# Patient Record
Sex: Female | Born: 1940 | Marital: Married
Health system: Southern US, Community
[De-identification: ages and names within clinical notes are randomized; demographics above are authoritative.]

---

## 2013-10-13 ENCOUNTER — Inpatient Hospital Stay
Admission: AD | Admit: 2013-10-13 | Discharge: 2013-11-02 | Disposition: A | Discharge status: Home Health | Payer: Self-pay | Source: Ambulatory Visit | Attending: Internal Medicine | Admitting: Internal Medicine

## 2013-10-13 LAB — BLOOD GAS, ARTERIAL
Acid-Base Excess: 12.2 mmol/L — ABNORMAL HIGH (ref 0.0–2.0)
Bicarbonate: 37.7 mEq/L — ABNORMAL HIGH (ref 20.0–24.0)
Delivery systems: POSITIVE
EXPIRATORY PAP: 6
FIO2: 0.7 %
Inspiratory PAP: 16
O2 Saturation: 98.7 %
PO2 ART: 146 mmHg — AB (ref 80.0–100.0)
Patient temperature: 98.6
TCO2: 39.6 mmol/L (ref 0–100)
pCO2 arterial: 63.7 mmHg (ref 35.0–45.0)
pH, Arterial: 7.389 (ref 7.350–7.450)

## 2013-10-14 ENCOUNTER — Other Ambulatory Visit (HOSPITAL_COMMUNITY): Payer: Self-pay

## 2013-10-14 LAB — CBC
HCT: 24 % — ABNORMAL LOW (ref 36.0–46.0)
HEMOGLOBIN: 7.3 g/dL — AB (ref 12.0–15.0)
MCH: 33.8 pg (ref 26.0–34.0)
MCHC: 30.4 g/dL (ref 30.0–36.0)
MCV: 111.1 fL — ABNORMAL HIGH (ref 78.0–100.0)
Platelets: 304 10*3/uL (ref 150–400)
RBC: 2.16 MIL/uL — ABNORMAL LOW (ref 3.87–5.11)
RDW: 14.9 % (ref 11.5–15.5)
WBC: 19.2 10*3/uL — AB (ref 4.0–10.5)

## 2013-10-14 LAB — COMPREHENSIVE METABOLIC PANEL
ALK PHOS: 49 U/L (ref 39–117)
ALT: 45 U/L — AB (ref 0–35)
AST: 60 U/L — ABNORMAL HIGH (ref 0–37)
Albumin: 2.1 g/dL — ABNORMAL LOW (ref 3.5–5.2)
Anion gap: 11 (ref 5–15)
BUN: 47 mg/dL — ABNORMAL HIGH (ref 6–23)
CO2: 38 mEq/L — ABNORMAL HIGH (ref 19–32)
Calcium: 8.7 mg/dL (ref 8.4–10.5)
Chloride: 103 mEq/L (ref 96–112)
Creatinine, Ser: 1.14 mg/dL — ABNORMAL HIGH (ref 0.50–1.10)
GFR, EST AFRICAN AMERICAN: 54 mL/min — AB (ref >90)
GFR, EST NON AFRICAN AMERICAN: 47 mL/min — AB (ref >90)
GLUCOSE: 135 mg/dL — AB (ref 70–99)
POTASSIUM: 3.2 meq/L — AB (ref 3.7–5.3)
SODIUM: 152 meq/L — AB (ref 137–147)
Total Bilirubin: 0.3 mg/dL (ref 0.3–1.2)
Total Protein: 5 g/dL — ABNORMAL LOW (ref 6.0–8.3)

## 2013-10-14 LAB — TYPE AND SCREEN
ABO/RH(D): O NEG
Antibody Screen: NEGATIVE

## 2013-10-14 LAB — VITAMIN B12: Vitamin B-12: 569 pg/mL (ref 211–911)

## 2013-10-14 LAB — ABO/RH: ABO/RH(D): O NEG

## 2013-10-14 NOTE — Progress Notes (Signed)
[facility]                                                                                              Progress note     Patient Demographics  Breanna King, is a 73 y.o. female  ZOX:[id number]  [id number]  DOB - [birth year]  Admit date - 10/13/2013  Admitting Physician Carron Curie, MD  Outpatient Primary MD for the patient is No PCP Per Patient  LOS - 1   Chief complaint Respiratory failure Pneumonia         Subjective:   [name] today has, No headache, No chest pain, still complaining of shortness of breath  Objective:   Vital signs  Temperature 98.8 Heart rate 66 Respiratory rate 107 Blood pressure 106/66 Pulse ox 100%    Exam Drowsy, very hard of hearing and confused [place].AT,PERRAL Supple Neck,No JVD, No cervical lymphadenopathy appriciated.  Symmetrical Chest wall movement, decreased breath sounds bilaterally with mild rhonchi RRR,No Gallops,Rubs or new Murmurs, No Parasternal Heave +ve B.Sounds, Abd Soft, Non tender, No organomegaly appriciated, No rebound - guarding or rigidity. No Cyanosis, Clubbing +2 edema,    I&Os -400    Data Review   CBC  Recent Labs Lab 10/14/13 0745  WBC 19.2*  HGB 7.3*  HCT 24.0*  PLT 304  MCV 111.1*  MCH 33.8  MCHC 30.4  RDW 14.9    Chemistries   Recent Labs Lab 10/14/13 0745  NA 152*  K 3.2*  CL 103  CO2 38*  GLUCOSE 135*  BUN 47*  CREATININE 1.14*  CALCIUM 8.7  AST 60*  ALT 45*  ALKPHOS 49  BILITOT 0.3   ------------------------------------------------------------------------------------------------------------------ CrCl is unknown because there is no height on file for the current visit. ------------------------------------------------------------------------------------------------------------------ No results found for this basename: HGBA1C,  in the last 72  hours ------------------------------------------------------------------------------------------------------------------ No results found for this basename: CHOL, HDL, LDLCALC, TRIG, CHOLHDL, LDLDIRECT,  in the last 72 hours ------------------------------------------------------------------------------------------------------------------ No results found for this basename: TSH, T4TOTAL, FREET3, T3FREE, THYROIDAB,  in the last 72 hours ------------------------------------------------------------------------------------------------------------------  Recent Labs  10/14/13 0745  VITAMINB12 569    Coagulation profile No results found for this basename: INR, PROTIME,  in the last 168 hours  No results found for this basename: DDIMER,  in the last 72 hours  Cardiac Enzymes No results found for this basename: CK, CKMB, TROPONINI, MYOGLOBIN,  in the last 168 hours ------------------------------------------------------------------------------------------------------------------ No components found with this basename: POCBNP,   Micro Results No results found for this or any previous visit (from the past 240 hour(s)).     Assessment & Plan   Respiratory failure on high flow oxygen during the day and BiPAP at night Community acquired pneumonia minimal changed to Zosyn History of GI bleed not a candidate for endoscopic evaluation off blood thinners History of atrial fibrillation with normal sinus rhythm at this time not on chronic anticoagulation History of coronary artery disease status post 2 stent placements last one was 2007 off Plavix due to bleeding COPD/obstructive sleep apnea continue with nebulizer treatments, BiPAP and prednisone Diabetes mellitus type 2, continue with general  via full insulin sliding scale Anemia of chronic disease hyperlipidemia Recent subcutaneous hematoma Hypernatremia on D5 water Hypokalemia replete  Plan  Check ABGs in a.m. D5 water Change meropenem  to Zosyn Replace potassium Taper prednisone Critical care time; 31 minutes  Code Status: Full  Family Communication: At bedside    DVT Prophylaxis   SCDs   Carron Curie M.D on 10/14/2013 at 2:24 PM

## 2013-10-15 LAB — BLOOD GAS, ARTERIAL
Acid-Base Excess: 11.7 mmol/L — ABNORMAL HIGH (ref 0.0–2.0)
Bicarbonate: 36.7 mEq/L — ABNORMAL HIGH (ref 20.0–24.0)
Delivery systems: POSITIVE
Expiratory PAP: 8
FIO2: 0.5 %
Inspiratory PAP: 18
O2 SAT: 99 %
PATIENT TEMPERATURE: 98
TCO2: 38.5 mmol/L (ref 0–100)
pCO2 arterial: 57.3 mmHg (ref 35.0–45.0)
pH, Arterial: 7.421 (ref 7.350–7.450)
pO2, Arterial: 125 mmHg — ABNORMAL HIGH (ref 80.0–100.0)

## 2013-10-15 LAB — BASIC METABOLIC PANEL
ANION GAP: 10 (ref 5–15)
BUN: 33 mg/dL — ABNORMAL HIGH (ref 6–23)
CO2: 33 meq/L — AB (ref 19–32)
Calcium: 9.1 mg/dL (ref 8.4–10.5)
Chloride: 107 mEq/L (ref 96–112)
Creatinine, Ser: 0.9 mg/dL (ref 0.50–1.10)
GFR calc Af Amer: 72 mL/min — ABNORMAL LOW (ref >90)
GFR, EST NON AFRICAN AMERICAN: 62 mL/min — AB (ref >90)
Glucose, Bld: 277 mg/dL — ABNORMAL HIGH (ref 70–99)
POTASSIUM: 4.6 meq/L (ref 3.7–5.3)
SODIUM: 150 meq/L — AB (ref 137–147)

## 2013-10-15 NOTE — Progress Notes (Addendum)
[facility]                                                                                              Progress note     Patient Demographics  Breanna King, is a 73 y.o. female  [id number]  [id number]  DOB - 03-03-1940  Admit date - 10/13/2013  Admitting Physician Carron Curie, MD  Outpatient Primary MD for the patient is No PCP Per Patient  LOS - 2   Chief complaint Respiratory failure Pneumonia         Subjective:   Breanna King today has, No headache, No chest pain, still complaining of shortness of breath  Objective:   Vital signs  Temperature 97.6 Heart rate 89 Respiratory rate 15 Blood pressure 141/79 Pulse ox 100%    Exam Drowsy, very hard of hearing and confused [place].AT,PERRAL Supple Neck,No JVD, No cervical lymphadenopathy appriciated.  Symmetrical Chest wall movement, decreased breath sounds bilaterally with mild rhonchi RRR,No Gallops,Rubs or new Murmurs, No Parasternal Heave +ve B.Sounds, Abd Soft, Non tender, No organomegaly appriciated, No rebound - guarding or rigidity. No Cyanosis, Clubbing +2 edema,    I&Os unknown    Data Review   CBC  Recent Labs Lab 10/14/13 0745  WBC 19.2*  HGB 7.3*  HCT 24.0*  PLT 304  MCV 111.1*  MCH 33.8  MCHC 30.4  RDW 14.9    Chemistries   Recent Labs Lab 10/14/13 0745 10/15/13 0527  NA 152* 150*  K 3.2* 4.6  CL 103 107  CO2 38* 33*  GLUCOSE 135* 277*  BUN 47* 33*  CREATININE 1.14* 0.90  CALCIUM 8.7 9.1  AST 60*  --   ALT 45*  --   ALKPHOS 49  --   BILITOT 0.3  --    ------------------------------------------------------------------------------------------------------------------ CrCl is unknown because there is no height on file for the current visit. ------------------------------------------------------------------------------------------------------------------ No results found  for this basename: HGBA1C,  in the last 72 hours ------------------------------------------------------------------------------------------------------------------ No results found for this basename: CHOL, HDL, LDLCALC, TRIG, CHOLHDL, LDLDIRECT,  in the last 72 hours ------------------------------------------------------------------------------------------------------------------ No results found for this basename: TSH, T4TOTAL, FREET3, T3FREE, THYROIDAB,  in the last 72 hours ------------------------------------------------------------------------------------------------------------------  Recent Labs  10/14/13 0745  VITAMINB12 569    Coagulation profile No results found for this basename: INR, PROTIME,  in the last 168 hours  No results found for this basename: DDIMER,  in the last 72 hours  Cardiac Enzymes No results found for this basename: CK, CKMB, TROPONINI, MYOGLOBIN,  in the last 168 hours ------------------------------------------------------------------------------------------------------------------ No components found with this basename: POCBNP,   Micro Results No results found for this or any previous visit (from the past 240 hour(s)).     Assessment & Plan   Respiratory failure on high flow oxygen during the day and BiPAP at night Community acquired pneumonia minimal changed to Zosyn History of GI bleed not a candidate for endoscopic evaluation off blood thinners History of atrial fibrillation with normal sinus rhythm at this time not on chronic anticoagulation History of coronary artery disease status post 2 stent placements last one was 2007  off Plavix due to bleeding COPD/obstructive sleep apnea continue with nebulizer treatments, BiPAP and prednisone Diabetes mellitus type 2, uncontrolled due to D5 water start Levemir and continue insulin sliding scale Anemia of chronic disease hyperlipidemia Recent subcutaneous hematoma Hypernatremia on D5  water Hypokalemia replete   Plan  Change IV fluids to D5 water Check BMP in a.m. Start Lantus Critical care time 33 minutes Code Status: Full  Family Communication: At bedside    DVT Prophylaxis   SCDs   Carron Curie M.D on 10/15/2013 at 11:41 AM

## 2013-10-16 LAB — BLOOD GAS, ARTERIAL
Acid-Base Excess: 10.6 mmol/L — ABNORMAL HIGH (ref 0.0–2.0)
BICARBONATE: 35.4 meq/L — AB (ref 20.0–24.0)
FIO2: 0.6 %
O2 Saturation: 98.6 %
PCO2 ART: 54.5 mmHg — AB (ref 35.0–45.0)
PH ART: 7.429 (ref 7.350–7.450)
PO2 ART: 101 mmHg — AB (ref 80.0–100.0)
Patient temperature: 98.6
TCO2: 37.1 mmol/L (ref 0–100)

## 2013-10-16 LAB — BASIC METABOLIC PANEL
Anion gap: 8 (ref 5–15)
BUN: 22 mg/dL (ref 6–23)
CALCIUM: 8.8 mg/dL (ref 8.4–10.5)
CHLORIDE: 103 meq/L (ref 96–112)
CO2: 34 meq/L — AB (ref 19–32)
CREATININE: 0.91 mg/dL (ref 0.50–1.10)
GFR calc Af Amer: 71 mL/min — ABNORMAL LOW (ref >90)
GFR calc non Af Amer: 62 mL/min — ABNORMAL LOW (ref >90)
Glucose, Bld: 123 mg/dL — ABNORMAL HIGH (ref 70–99)
Potassium: 4.8 mEq/L (ref 3.7–5.3)
Sodium: 145 mEq/L (ref 137–147)

## 2013-10-16 NOTE — Progress Notes (Signed)
[facility]                                                                                              Progress note     Patient Demographics  Breanna King, is a 73 y.o. female  [id number]  [id number]  DOB - 1941-02-14  Admit date - 10/13/2013  Admitting Physician Carron Curie, MD  Outpatient Primary MD for the patient is No PCP Per Patient  LOS - 3   Chief complaint Respiratory failure Pneumonia         Subjective:   [name] obtunded, cannot give any history  Objective:   Vital signs  Temperature 99.6 Heart rate 84 Respiratory rate 15 Blood pressure 92/66 Pulse ox 95%    Exam Obtunded [place].AT,PERRAL Supple Neck,No JVD, No cervical lymphadenopathy appriciated.  Symmetrical Chest wall movement, decreased breath sounds bilaterally with mild rhonchi RRR,No Gallops,Rubs or new Murmurs, No Parasternal Heave +ve B.Sounds, Abd Soft, Non tender, No organomegaly appriciated, No rebound - guarding or rigidity. No Cyanosis, Clubbing +2 edema,    I&Os unknown    Data Review   CBC  Recent Labs Lab 10/14/13 0745  WBC 19.2*  HGB 7.3*  HCT 24.0*  PLT 304  MCV 111.1*  MCH 33.8  MCHC 30.4  RDW 14.9    Chemistries   Recent Labs Lab 10/14/13 0745 10/15/13 0527 10/16/13 0720  NA 152* 150* 145  K 3.2* 4.6 4.8  CL 103 107 103  CO2 38* 33* 34*  GLUCOSE 135* 277* 123*  BUN 47* 33* 22  CREATININE 1.14* 0.90 0.91  CALCIUM 8.7 9.1 8.8  AST 60*  --   --   ALT 45*  --   --   ALKPHOS 49  --   --   BILITOT 0.3  --   --    ------------------------------------------------------------------------------------------------------------------ CrCl is unknown because there is no height on file for the current visit. ------------------------------------------------------------------------------------------------------------------ No results found for this  basename: HGBA1C,  in the last 72 hours ------------------------------------------------------------------------------------------------------------------ No results found for this basename: CHOL, HDL, LDLCALC, TRIG, CHOLHDL, LDLDIRECT,  in the last 72 hours ------------------------------------------------------------------------------------------------------------------ No results found for this basename: TSH, T4TOTAL, FREET3, T3FREE, THYROIDAB,  in the last 72 hours ------------------------------------------------------------------------------------------------------------------  Recent Labs  10/14/13 0745  VITAMINB12 569    Coagulation profile No results found for this basename: INR, PROTIME,  in the last 168 hours  No results found for this basename: DDIMER,  in the last 72 hours  Cardiac Enzymes No results found for this basename: CK, CKMB, TROPONINI, MYOGLOBIN,  in the last 168 hours ------------------------------------------------------------------------------------------------------------------ No components found with this basename: POCBNP,   Micro Results No results found for this or any previous visit (from the past 240 hour(s)).     Assessment & Plan   Respiratory failure on high flow oxygen during the day and BiPAP at night, has been on BiPAP most of the time Community acquired pneumonia minimal changed to Zosyn History of GI bleed not a candidate for endoscopic evaluation off blood thinners History of atrial fibrillation with normal sinus rhythm at this time not on  chronic anticoagulation History of coronary artery disease status post 2 stent placements last one was 2007 off Plavix due to bleeding COPD/obstructive sleep apnea continue with nebulizer treatments, BiPAP and prednisone Diabetes mellitus type 2, uncontrolled due to D5 water start Levemir and continue insulin sliding scale Anemia of chronic disease hyperlipidemia Recent subcutaneous  hematoma Hypernatremia on D5 water Hypokalemia replete   Plan  Decrease IV fluids Check ABG stat Start Lantus Critical care time 33 minutes Code Status: Full  Family Communication: At bedside    DVT Prophylaxis   SCDs   Carron Curie M.D on 10/16/2013 at 9:18 AM

## 2013-10-17 LAB — BASIC METABOLIC PANEL
Anion gap: 8 (ref 5–15)
BUN: 19 mg/dL (ref 6–23)
CHLORIDE: 104 meq/L (ref 96–112)
CO2: 33 meq/L — AB (ref 19–32)
CREATININE: 0.92 mg/dL (ref 0.50–1.10)
Calcium: 9 mg/dL (ref 8.4–10.5)
GFR calc non Af Amer: 61 mL/min — ABNORMAL LOW (ref >90)
GFR, EST AFRICAN AMERICAN: 70 mL/min — AB (ref >90)
Glucose, Bld: 81 mg/dL (ref 70–99)
Potassium: 5.7 mEq/L — ABNORMAL HIGH (ref 3.7–5.3)
SODIUM: 145 meq/L (ref 137–147)

## 2013-10-17 NOTE — Progress Notes (Signed)
[facility]                                                                                              Progress note     Patient Demographics  Breanna King, is a 73 y.o. female  [id number]  [id number]  DOB - September 28, 1940  Admit date - 10/13/2013  Admitting Physician Carron Curie, MD  Outpatient Primary MD for the patient is No PCP Per Patient  LOS - 4   Chief complaint Respiratory failure Pneumonia         Subjective:   [name] awake and alert, cannot give any history  Objective:   Vital signs  Temperature 97.6 Heart rate 97 Respiratory rate 12 Blood pressure 136/88 Pulse ox 92%    Exam Obtunded [place].AT,PERRAL Supple Neck,No JVD, No cervical lymphadenopathy appriciated.  Symmetrical Chest wall movement, decreased breath sounds bilaterally with mild rhonchi RRR,No Gallops,Rubs or new Murmurs, No Parasternal Heave +ve B.Sounds, Abd Soft, Non tender, No organomegaly appriciated, No rebound - guarding or rigidity. No Cyanosis, Clubbing +2 edema,    I&Os +1260    Data Review   CBC  Recent Labs Lab 10/14/13 0745  WBC 19.2*  HGB 7.3*  HCT 24.0*  PLT 304  MCV 111.1*  MCH 33.8  MCHC 30.4  RDW 14.9    Chemistries   Recent Labs Lab 10/14/13 0745 10/15/13 0527 10/16/13 0720 10/17/13 0600  NA 152* 150* 145 145  K 3.2* 4.6 4.8 5.7*  CL 103 107 103 104  CO2 38* 33* 34* 33*  GLUCOSE 135* 277* 123* 81  BUN 47* 33* 22 19  CREATININE 1.14* 0.90 0.91 0.92  CALCIUM 8.7 9.1 8.8 9.0  AST 60*  --   --   --   ALT 45*  --   --   --   ALKPHOS 49  --   --   --   BILITOT 0.3  --   --   --    ------------------------------------------------------------------------------------------------------------------ CrCl is unknown because there is no height on file for the current  visit. ------------------------------------------------------------------------------------------------------------------ No results found for this basename: HGBA1C,  in the last 72 hours ------------------------------------------------------------------------------------------------------------------ No results found for this basename: CHOL, HDL, LDLCALC, TRIG, CHOLHDL, LDLDIRECT,  in the last 72 hours ------------------------------------------------------------------------------------------------------------------ No results found for this basename: TSH, T4TOTAL, FREET3, T3FREE, THYROIDAB,  in the last 72 hours ------------------------------------------------------------------------------------------------------------------ No results found for this basename: VITAMINB12, FOLATE, FERRITIN, TIBC, IRON, RETICCTPCT,  in the last 72 hours  Coagulation profile No results found for this basename: INR, PROTIME,  in the last 168 hours  No results found for this basename: DDIMER,  in the last 72 hours  Cardiac Enzymes No results found for this basename: CK, CKMB, TROPONINI, MYOGLOBIN,  in the last 168 hours ------------------------------------------------------------------------------------------------------------------ No components found with this basename: POCBNP,   Micro Results No results found for this or any previous visit (from the past 240 hour(s)).     Assessment & Plan   Respiratory failure on high flow oxygen during the day and BiPAP at night, has been on BiPAP most of the time Community acquired  pneumonia minimal changed to Zosyn History of GI bleed not a candidate for endoscopic evaluation off blood thinners History of atrial fibrillation with normal sinus rhythm at this time not on chronic anticoagulation History of coronary artery disease status post 2 stent placements last one was 2007 off Plavix due to bleeding COPD/obstructive sleep apnea continue with nebulizer  treatments, BiPAP and prednisone Diabetes mellitus type 2, uncontrolled due to D5 water start Levemir and continue insulin sliding scale Anemia of chronic disease hyperlipidemia Recent subcutaneous hematoma Hypernatremia on D5 water, resolving Hyperkalemia discontinue by mouth potassium   Plan  Discontinue IV fluids Discontinue by mouth potassium Check BMP in a.m. Code Status: Full  Family Communication: At bedside    DVT Prophylaxis   SCDs   Carron Curie M.D on 10/17/2013 at 11:10 AM

## 2013-10-18 LAB — BASIC METABOLIC PANEL
ANION GAP: 10 (ref 5–15)
BUN: 16 mg/dL (ref 6–23)
CALCIUM: 9 mg/dL (ref 8.4–10.5)
CHLORIDE: 104 meq/L (ref 96–112)
CO2: 33 meq/L — AB (ref 19–32)
CREATININE: 0.98 mg/dL (ref 0.50–1.10)
GFR calc non Af Amer: 56 mL/min — ABNORMAL LOW (ref >90)
GFR, EST AFRICAN AMERICAN: 65 mL/min — AB (ref >90)
Glucose, Bld: 54 mg/dL — ABNORMAL LOW (ref 70–99)
Potassium: 4.2 mEq/L (ref 3.7–5.3)
SODIUM: 147 meq/L (ref 137–147)

## 2013-10-18 NOTE — Progress Notes (Signed)
[facility]                                                                                              Progress note     Patient Demographics  Breanna King, is a 73 y.o. female  [id number]  [id number]  DOB - 1940/11/09  Admit date - 10/13/2013  Admitting Physician Carron Curie, MD  Outpatient Primary MD for the patient is No PCP Per Patient  LOS - 5   Chief complaint Respiratory failure Pneumonia         Subjective:   [name] awake and alert, confused  Objective:   Vital signs  Temperature 98.1 Heart rate 82 Respiratory rate 20 Blood pressure 109/70 Pulse ox 99%    Exam Obtunded [place].AT,PERRAL Supple Neck,No JVD, No cervical lymphadenopathy appriciated.  Symmetrical Chest wall movement, decreased breath sounds bilaterally with mild rhonchi RRR,No Gallops,Rubs or new Murmurs, No Parasternal Heave +ve B.Sounds, Abd Soft, Non tender, No organomegaly appriciated, No rebound - guarding or rigidity. No Cyanosis, Clubbing +2 edema,    I&Os +115    Data Review   CBC  Recent Labs Lab 10/14/13 0745  WBC 19.2*  HGB 7.3*  HCT 24.0*  PLT 304  MCV 111.1*  MCH 33.8  MCHC 30.4  RDW 14.9    Chemistries   Recent Labs Lab 10/14/13 0745 10/15/13 0527 10/16/13 0720 10/17/13 0600 10/18/13 0736  NA 152* 150* 145 145 147  K 3.2* 4.6 4.8 5.7* 4.2  CL 103 107 103 104 104  CO2 38* 33* 34* 33* 33*  GLUCOSE 135* 277* 123* 81 54*  BUN 47* 33* CREATININE 1.14* 0.90 0.91 0.92 0.98  CALCIUM 8.7 9.1 8.8 9.0 9.0  AST 60*  --   --   --   --   ALT 45*  --   --   --   --   ALKPHOS 49  --   --   --   --   BILITOT 0.3  --   --   --   --    ------------------------------------------------------------------------------------------------------------------ CrCl is unknown because there is no height on file for the current  visit. ------------------------------------------------------------------------------------------------------------------ No results found for this basename: HGBA1C,  in the last 72 hours ------------------------------------------------------------------------------------------------------------------ No results found for this basename: CHOL, HDL, LDLCALC, TRIG, CHOLHDL, LDLDIRECT,  in the last 72 hours ------------------------------------------------------------------------------------------------------------------ No results found for this basename: TSH, T4TOTAL, FREET3, T3FREE, THYROIDAB,  in the last 72 hours ------------------------------------------------------------------------------------------------------------------ No results found for this basename: VITAMINB12, FOLATE, FERRITIN, TIBC, IRON, RETICCTPCT,  in the last 72 hours  Coagulation profile No results found for this basename: INR, PROTIME,  in the last 168 hours  No results found for this basename: DDIMER,  in the last 72 hours  Cardiac Enzymes No results found for this basename: CK, CKMB, TROPONINI, MYOGLOBIN,  in the last 168 hours ------------------------------------------------------------------------------------------------------------------ No components found with this basename: POCBNP,   Micro Results No results found for this or any previous visit (from the past 240 hour(s)).     Assessment & Plan   Respiratory failure on high  flow oxygen during the day and BiPAP at night, and when necessary Community acquired pneumonia minimal changed to Zosyn History of GI bleed not a candidate for endoscopic evaluation off blood thinners History of atrial fibrillation with normal sinus rhythm at this time not on chronic anticoagulation History of coronary artery disease status post 2 stent placements last one was 2007 off Plavix due to bleeding COPD/obstructive sleep apnea continue with nebulizer treatments, BiPAP and  prednisone Diabetes mellitus type 2, uncontrolled due to D5 water start Levemir and continue insulin sliding scale Anemia of chronic disease hyperlipidemia Recent subcutaneous hematoma Hypernatremia on D5 water, resolving Hyperkalemia discontinue by mouth potassium   Plan  Continue same treatment Code Status: Full  Family Communication: At bedside    DVT Prophylaxis   SCDs   Carron Curie M.D on 10/18/2013 at 2:11 PM

## 2013-10-19 ENCOUNTER — Other Ambulatory Visit (HOSPITAL_COMMUNITY): Payer: Self-pay

## 2013-10-19 LAB — CBC
HEMATOCRIT: 23.4 % — AB (ref 36.0–46.0)
HEMOGLOBIN: 7.2 g/dL — AB (ref 12.0–15.0)
MCH: 35.1 pg — ABNORMAL HIGH (ref 26.0–34.0)
MCHC: 30.8 g/dL (ref 30.0–36.0)
MCV: 114.1 fL — ABNORMAL HIGH (ref 78.0–100.0)
Platelets: 291 10*3/uL (ref 150–400)
RBC: 2.05 MIL/uL — ABNORMAL LOW (ref 3.87–5.11)
RDW: 16 % — AB (ref 11.5–15.5)
WBC: 11.9 10*3/uL — AB (ref 4.0–10.5)

## 2013-10-19 NOTE — Progress Notes (Signed)
[facility]                                                                                              Progress note     Patient Demographics  Breanna King, is a 73 y.o. female  ZOX:[id number]  [id number]  DOB - 03-18-1940  Admit date - 10/13/2013  Admitting Physician Carron Curie, MD  Outpatient Primary MD for the patient is No PCP Per Patient  LOS - 6   Chief complaint Respiratory failure Pneumonia         Subjective:   Breanna King awake and alert, confused, necessitating more BiPAP lately Objective:   Vital signs  Temperature 98.2 Heart rate 79 Respiratory rate 20 Blood pressure 131/80 Pulse ox 98%    Exam Obtunded [name].AT,PERRAL Supple Neck,No JVD, No cervical lymphadenopathy appriciated.  Symmetrical Chest wall movement, decreased breath sounds bilaterally with mild rhonchi RRR,No Gallops,Rubs or new Murmurs, No Parasternal Heave +ve B.Sounds, Abd Soft, Non tender, No organomegaly appriciated, No rebound - guarding or rigidity. No Cyanosis, Clubbing +2 edema,    I&Os -420    Data Review   CBC  Recent Labs Lab 10/14/13 0745  WBC 19.2*  HGB 7.3*  HCT 24.0*  PLT 304  MCV 111.1*  MCH 33.8  MCHC 30.4  RDW 14.9    Chemistries   Recent Labs Lab 10/14/13 0745 10/15/13 0527 10/16/13 0720 10/17/13 0600 10/18/13 0736  NA 152* 150* 145 145 147  K 3.2* 4.6 4.8 5.7* 4.2  CL 103 107 103 104 104  CO2 38* 33* 34* 33* 33*  GLUCOSE 135* 277* 123* 81 54*  BUN 47* 33* CREATININE 1.14* 0.90 0.91 0.92 0.98  CALCIUM 8.7 9.1 8.8 9.0 9.0  AST 60*  --   --   --   --   ALT 45*  --   --   --   --   ALKPHOS 49  --   --   --   --   BILITOT 0.3  --   --   --   --    ------------------------------------------------------------------------------------------------------------------ CrCl is unknown because there is no height on file for the current  visit. ------------------------------------------------------------------------------------------------------------------ No results found for this basename: HGBA1C,  in the last 72 hours ------------------------------------------------------------------------------------------------------------------ No results found for this basename: CHOL, HDL, LDLCALC, TRIG, CHOLHDL, LDLDIRECT,  in the last 72 hours ------------------------------------------------------------------------------------------------------------------ No results found for this basename: TSH, T4TOTAL, FREET3, T3FREE, THYROIDAB,  in the last 72 hours ------------------------------------------------------------------------------------------------------------------ No results found for this basename: VITAMINB12, FOLATE, FERRITIN, TIBC, IRON, RETICCTPCT,  in the last 72 hours  Coagulation profile No results found for this basename: INR, PROTIME,  in the last 168 hours  No results found for this basename: DDIMER,  in the last 72 hours  Cardiac Enzymes No results found for this basename: CK, CKMB, TROPONINI, MYOGLOBIN,  in the last 168 hours ------------------------------------------------------------------------------------------------------------------ No components found with this basename: POCBNP,   Micro Results No results found for this or any previous visit (from the past 240 hour(s)).     Assessment & Plan   Respiratory  failure on high flow oxygen during the day and BiPAP at night, and when necessary, Community acquired pneumonia meropenem changed to Zosyn History of GI bleed not a candidate for endoscopic evaluation off blood thinners History of atrial fibrillation with normal sinus rhythm at this time not on chronic anticoagulation History of coronary artery disease status post 2 stent placements last one was 2007 off Plavix due to bleeding COPD/obstructive sleep apnea continue with nebulizer treatments, BiPAP and  prednisone Diabetes mellitus type 2, uncontrolled due to D5 water start Levemir and continue insulin sliding scale Anemia of chronic disease hyperlipidemia Recent subcutaneous hematoma Hypernatremia resolved Hyperkalemia  Increased confusion  Plan  Check CBC/portable chest x-ray today Change levemir to 5 units every morning Discontinue Elavil Critical care time: 32 minutes Code Status: Full  Family Communication: At bedside    DVT Prophylaxis   SCDs   Carron Curie M.D on 10/19/2013 at 12:00 PM

## 2013-10-20 LAB — PREPARE RBC (CROSSMATCH)

## 2013-10-20 NOTE — Progress Notes (Signed)
[facility]                                                                                              Progress note     Patient Demographics  Samoria Fedorko, is a 73 y.o. female  UJW:[id number]  [id number]  DOB - 05-Jan-1941  Admit date - 10/13/2013  Admitting Physician Carron Curie, MD  Outpatient Primary MD for the patient is No PCP Per Patient  LOS - 7   Chief complaint Respiratory failure Pneumonia         Subjective:   [name] awake and alert,   Objective:   Vital signs  Temperature 98.8 Heart rate 87 Respiratory rate 20 Blood pressure 103/66 Pulse ox 84%    Exam More awake [place].AT,PERRAL Supple Neck,No JVD, No cervical lymphadenopathy appriciated.  Symmetrical Chest wall movement, decreased breath sounds bilaterally with mild rhonchi RRR,No Gallops,Rubs or new Murmurs, No Parasternal Heave +ve B.Sounds, Abd Soft, Non tender, No organomegaly appriciated, No rebound - guarding or rigidity. No Cyanosis, Clubbing +2 edema,    I&Os -420    Data Review   CBC  Recent Labs Lab 10/14/13 0745 10/19/13 1219  WBC 19.2* 11.9*  HGB 7.3* 7.2*  HCT 24.0* 23.4*  PLT 304 291  MCV 111.1* 114.1*  MCH 33.8 35.1*  MCHC 30.4 30.8  RDW 14.9 16.0*    Chemistries   Recent Labs Lab 10/14/13 0745 10/15/13 0527 10/16/13 0720 10/17/13 0600 10/18/13 0736  NA 152* 150* 145 145 147  K 3.2* 4.6 4.8 5.7* 4.2  CL 103 107 103 104 104  CO2 38* 33* 34* 33* 33*  GLUCOSE 135* 277* 123* 81 54*  BUN 47* 33* CREATININE 1.14* 0.90 0.91 0.92 0.98  CALCIUM 8.7 9.1 8.8 9.0 9.0  AST 60*  --   --   --   --   ALT 45*  --   --   --   --   ALKPHOS 49  --   --   --   --   BILITOT 0.3  --   --   --   --    ------------------------------------------------------------------------------------------------------------------ CrCl is unknown because there is no height  on file for the current visit. ------------------------------------------------------------------------------------------------------------------ No results found for this basename: HGBA1C,  in the last 72 hours ------------------------------------------------------------------------------------------------------------------ No results found for this basename: CHOL, HDL, LDLCALC, TRIG, CHOLHDL, LDLDIRECT,  in the last 72 hours ------------------------------------------------------------------------------------------------------------------ No results found for this basename: TSH, T4TOTAL, FREET3, T3FREE, THYROIDAB,  in the last 72 hours ------------------------------------------------------------------------------------------------------------------ No results found for this basename: VITAMINB12, FOLATE, FERRITIN, TIBC, IRON, RETICCTPCT,  in the last 72 hours  Coagulation profile No results found for this basename: INR, PROTIME,  in the last 168 hours  No results found for this basename: DDIMER,  in the last 72 hours  Cardiac Enzymes No results found for this basename: CK, CKMB, TROPONINI, MYOGLOBIN,  in the last 168 hours ------------------------------------------------------------------------------------------------------------------ No components found with this basename: POCBNP,   Micro Results No results found for this or any previous visit (from the past 240 hour(s)).  Assessment & Plan   Respiratory failure on high flow oxygen during the day and BiPAP at night, and when necessary, Community acquired pneumonia meropenem changed to Zosyn History of GI bleed not a candidate for endoscopic evaluation off blood thinners History of atrial fibrillation with normal sinus rhythm at this time not on chronic anticoagulation History of coronary artery disease status post 2 stent placements last one was 2007 off Plavix due to bleeding COPD/obstructive sleep apnea continue with nebulizer  treatments, BiPAP and prednisone Diabetes mellitus type 2, uncontrolled due to D5 water start Levemir and continue insulin sliding scale Anemia of chronic disease hyperlipidemia Recent subcutaneous hematoma Hypernatremia resolved Hyperkalemia  Increased confusion  Plan  Transfuse 2 units of packed RBCs Lasix after each unit Try Oxymizer in a.m. Critical care time: 33 minutes Code Status: Full  Family Communication: At bedside    DVT Prophylaxis   SCDs   Carron Curie M.D on 10/20/2013 at 12:38 PM

## 2013-10-21 ENCOUNTER — Other Ambulatory Visit (HOSPITAL_COMMUNITY): Payer: Self-pay

## 2013-10-21 LAB — BLOOD GAS, ARTERIAL
Acid-Base Excess: 7.4 mmol/L — ABNORMAL HIGH (ref 0.0–2.0)
Bicarbonate: 31.4 mEq/L — ABNORMAL HIGH (ref 20.0–24.0)
O2 CONTENT: 7 L/min
O2 Saturation: 97 %
PCO2 ART: 44.6 mmHg (ref 35.0–45.0)
PH ART: 7.462 — AB (ref 7.350–7.450)
Patient temperature: 98.6
TCO2: 32.8 mmol/L (ref 0–100)
pO2, Arterial: 80.5 mmHg (ref 80.0–100.0)

## 2013-10-21 LAB — CBC
HEMATOCRIT: 33.8 % — AB (ref 36.0–46.0)
Hemoglobin: 11 g/dL — ABNORMAL LOW (ref 12.0–15.0)
MCH: 32.6 pg (ref 26.0–34.0)
MCHC: 32.5 g/dL (ref 30.0–36.0)
MCV: 100.3 fL — AB (ref 78.0–100.0)
PLATELETS: 245 10*3/uL (ref 150–400)
RBC: 3.37 MIL/uL — AB (ref 3.87–5.11)
RDW: 20.9 % — ABNORMAL HIGH (ref 11.5–15.5)
WBC: 12.9 10*3/uL — AB (ref 4.0–10.5)

## 2013-10-21 LAB — BASIC METABOLIC PANEL
Anion gap: 9 (ref 5–15)
BUN: 11 mg/dL (ref 6–23)
CHLORIDE: 98 meq/L (ref 96–112)
CO2: 36 meq/L — AB (ref 19–32)
Calcium: 9.2 mg/dL (ref 8.4–10.5)
Creatinine, Ser: 1 mg/dL (ref 0.50–1.10)
GFR calc Af Amer: 64 mL/min — ABNORMAL LOW (ref >90)
GFR calc non Af Amer: 55 mL/min — ABNORMAL LOW (ref >90)
Glucose, Bld: 105 mg/dL — ABNORMAL HIGH (ref 70–99)
Potassium: 3.8 mEq/L (ref 3.7–5.3)
Sodium: 143 mEq/L (ref 137–147)

## 2013-10-21 NOTE — Progress Notes (Signed)
[facility]                                                                                              Progress note     Patient Demographics  Breanna King, is a 72 y.o. female  [id number]  [id number]  DOB - Dec 31, 1940  Admit date - 10/13/2013  Admitting Physician Carron Curie, MD  Outpatient Primary MD for the patient is No PCP Per Patient  LOS - 8   Chief complaint Respiratory failure Pneumonia         Subjective:   Breanna King awake and alert,   Objective:   Vital signs  Temperature 98 Heart rate 66 Respiratory rate 20 Blood pressure 151 / 55 Pulse ox 99%    Exam Alert and awake Breanna King.AT,PERRAL Supple Neck,No JVD, No cervical lymphadenopathy appriciated.  Symmetrical Chest wall movement, decreased breath sounds bilaterally with mild rhonchi RRR,No Gallops,Rubs or new Murmurs, No Parasternal Heave +ve B.Sounds, Abd Soft, Non tender, No organomegaly appriciated, No rebound - guarding or rigidity. No Cyanosis, Clubbing +2 edema,    I&Os -530    Data Review   CBC  Recent Labs Lab 10/19/13 1219 10/21/13 0534  WBC 11.9* 12.9*  HGB 7.2* 11.0*  HCT 23.4* 33.8*  PLT 291 245  MCV 114.1* 100.3*  MCH 35.1* 32.6  MCHC 30.8 32.5  RDW 16.0* 20.9*    Chemistries   Recent Labs Lab 10/15/13 0527 10/16/13 0720 10/17/13 0600 10/18/13 0736 10/21/13 0534  NA 150* 145 145 147 143  K 4.6 4.8 5.7* 4.2 3.8  CL 107 103 104 104 98  CO2 33* 34* 33* 33* 36*  GLUCOSE 277* 123* 81 54* 105*  BUN 33* CREATININE 0.90 0.91 0.92 0.98 1.00  CALCIUM 9.1 8.8 9.0 9.0 9.2   ------------------------------------------------------------------------------------------------------------------ CrCl is unknown because there is no height on file for the current  visit. ------------------------------------------------------------------------------------------------------------------ No results found for this basename: HGBA1C,  in the last 72 hours ------------------------------------------------------------------------------------------------------------------ No results found for this basename: CHOL, HDL, LDLCALC, TRIG, CHOLHDL, LDLDIRECT,  in the last 72 hours ------------------------------------------------------------------------------------------------------------------ No results found for this basename: TSH, T4TOTAL, FREET3, T3FREE, THYROIDAB,  in the last 72 hours ------------------------------------------------------------------------------------------------------------------ No results found for this basename: VITAMINB12, FOLATE, FERRITIN, TIBC, IRON, RETICCTPCT,  in the last 72 hours  Coagulation profile No results found for this basename: INR, PROTIME,  in the last 168 hours  No results found for this basename: DDIMER,  in the last 72 hours  Cardiac Enzymes No results found for this basename: CK, CKMB, TROPONINI, MYOGLOBIN,  in the last 168 hours ------------------------------------------------------------------------------------------------------------------ No components found with this basename: POCBNP,   Micro Results No results found for this or any previous visit (from the past 240 hour(s)).     Assessment & Plan   Respiratory failure on 7 L Oxymizer during the day and BiPAP at night, and when necessary, Community acquired pneumonia meropenem changed to Zosyn History of GI bleed not a candidate for endoscopic evaluation off blood thinners History of atrial fibrillation with normal sinus rhythm at this time not on chronic  anticoagulation History of coronary artery disease status post 2 stent placements last one was 2007 off Plavix due to bleeding COPD/obstructive sleep apnea continue with nebulizer treatments, BiPAP and  prednisone Diabetes mellitus type 2, uncontrolled due to D5 water start Levemir and continue insulin sliding scale Anemia of chronic disease hyperlipidemia Recent subcutaneous hematoma Hypernatremia resolved Hyperkalemia  Lung mass Encephalopathy; improving  Plan  Continue with Oxymizer CT of chest pending Discussed with family at length and will meet him with them on Monday Critical care time: 37 minutes Code Status: Full  Family Communication: At bedside    DVT Prophylaxis   SCDs   Carron Curie M.D on 10/21/2013 at 2:21 PM

## 2013-10-22 LAB — TYPE AND SCREEN
ABO/RH(D): O NEG
Antibody Screen: NEGATIVE
UNIT DIVISION: 0
UNIT DIVISION: 0

## 2013-10-22 NOTE — Progress Notes (Signed)
[facility]                                                                                              Progress note     Patient Demographics  Breanna King, is a 73 y.o. female  [id number]  [id number]  DOB - 10/08/1940  Admit date - 10/13/2013  Admitting Physician Carron Curie, MD  Outpatient Primary MD for the patient is No PCP Per Patient  LOS - 9   Chief complaint Respiratory failure Pneumonia         Subjective:   [name] awake and alert,   Objective:   Vital signs  Temperature 98 Heart rate 66 Respiratory rate 20 Blood pressure 151 / 55 Pulse ox 99%    Exam Alert and awake [place].AT,PERRAL Supple Neck,No JVD, No cervical lymphadenopathy appriciated.  Symmetrical Chest wall movement, decreased breath sounds bilaterally with mild rhonchi RRR,No Gallops,Rubs or new Murmurs, No Parasternal Heave +ve B.Sounds, Abd Soft, Non tender, No organomegaly appriciated, No rebound - guarding or rigidity. No Cyanosis, Clubbing +2 edema,    I&Os 1000/450    Data Review   CBC  Recent Labs Lab 10/19/13 1219 10/21/13 0534  WBC 11.9* 12.9*  HGB 7.2* 11.0*  HCT 23.4* 33.8*  PLT 291 245  MCV 114.1* 100.3*  MCH 35.1* 32.6  MCHC 30.8 32.5  RDW 16.0* 20.9*    Chemistries   Recent Labs Lab 10/16/13 0720 10/17/13 0600 10/18/13 0736 10/21/13 0534  NA 145 145 147 143  K 4.8 5.7* 4.2 3.8  CL 103 104 104 98  CO2 34* 33* 33* 36*  GLUCOSE 123* 81 54* 105*  BUN CREATININE 0.91 0.92 0.98 1.00  CALCIUM 8.8 9.0 9.0 9.2   ------------------------------------------------------------------------------------------------------------------ CrCl is unknown because there is no height on file for the current visit. ------------------------------------------------------------------------------------------------------------------ No results found for  this basename: HGBA1C,  in the last 72 hours ------------------------------------------------------------------------------------------------------------------ No results found for this basename: CHOL, HDL, LDLCALC, TRIG, CHOLHDL, LDLDIRECT,  in the last 72 hours ------------------------------------------------------------------------------------------------------------------ No results found for this basename: TSH, T4TOTAL, FREET3, T3FREE, THYROIDAB,  in the last 72 hours ------------------------------------------------------------------------------------------------------------------ No results found for this basename: VITAMINB12, FOLATE, FERRITIN, TIBC, IRON, RETICCTPCT,  in the last 72 hours  Coagulation profile No results found for this basename: INR, PROTIME,  in the last 168 hours  No results found for this basename: DDIMER,  in the last 72 hours  Cardiac Enzymes No results found for this basename: CK, CKMB, TROPONINI, MYOGLOBIN,  in the last 168 hours ------------------------------------------------------------------------------------------------------------------ No components found with this basename: POCBNP,   Micro Results No results found for this or any previous visit (from the past 240 hour(s)).     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

## 2013-10-23 NOTE — Progress Notes (Signed)
[facility]                                                                                              Progress note     Patient Demographics  Breanna King, is a 73 y.o. female  ZOX:[id number]  [id number]  DOB - Jan 29, 1941  Admit date - 10/13/2013  Admitting Physician Carron Curie, MD  Outpatient Primary MD for the patient is No PCP Per Patient  LOS - 10   Chief complaint Respiratory failure Pneumonia         Subjective:   [name] awake and alert,   Objective:   Vital signs  Temperature 98.6 Heart rate 86 Respiratory rate 17 Blood pressure 158/72 Pulse ox 100%    Exam Alert and awake [name].AT,PERRAL Supple Neck,No JVD, No cervical lymphadenopathy appriciated.  Symmetrical Chest wall movement, decreased breath sounds bilaterally with mild rhonchi RRR,No Gallops,Rubs or new Murmurs, No Parasternal Heave +ve B.Sounds, Abd Soft, Non tender, No organomegaly appriciated, No rebound - guarding or rigidity. No Cyanosis, Clubbing +2 edema,    I&Os 120    Data Review   CBC  Recent Labs Lab 10/19/13 1219 10/21/13 0534  WBC 11.9* 12.9*  HGB 7.2* 11.0*  HCT 23.4* 33.8*  PLT 291 245  MCV 114.1* 100.3*  MCH 35.1* 32.6  MCHC 30.8 32.5  RDW 16.0* 20.9*    Chemistries   Recent Labs Lab 10/17/13 0600 10/18/13 0736 10/21/13 0534  NA 145 147 143  K 5.7* 4.2 3.8  CL 104 104 98  CO2 33* 33* 36*  GLUCOSE 81 54* 105*  BUN CREATININE 0.92 0.98 1.00  CALCIUM 9.0 9.0 9.2   ------------------------------------------------------------------------------------------------------------------ CrCl is unknown because there is no height on file for the current visit. ------------------------------------------------------------------------------------------------------------------ No results found for this basename: HGBA1C,  in the last 72  hours ------------------------------------------------------------------------------------------------------------------ No results found for this basename: CHOL, HDL, LDLCALC, TRIG, CHOLHDL, LDLDIRECT,  in the last 72 hours ------------------------------------------------------------------------------------------------------------------ No results found for this basename: TSH, T4TOTAL, FREET3, T3FREE, THYROIDAB,  in the last 72 hours ------------------------------------------------------------------------------------------------------------------ No results found for this basename: VITAMINB12, FOLATE, FERRITIN, TIBC, IRON, RETICCTPCT,  in the last 72 hours  Coagulation profile No results found for this basename: INR, PROTIME,  in the last 168 hours  No results found for this basename: DDIMER,  in the last 72 hours  Cardiac Enzymes No results found for this basename: CK, CKMB, TROPONINI, MYOGLOBIN,  in the last 168 hours ------------------------------------------------------------------------------------------------------------------ No components found with this basename: POCBNP,   Micro Results No results found for this or any previous visit (from the past 240 hour(s)).     Assessment & Plan   Respiratory failure on 4 L Oxymizer during the day and BiPAP at night, and when necessary, Community acquired pneumonia with bilateral infiltrates/masses on Zosyn History of GI bleed not a candidate for endoscopic evaluation off blood thinners History of atrial fibrillation with normal sinus rhythm at this time not on chronic anticoagulation History of coronary artery disease status post 2 stent placements last one was 2007 off Plavix due to bleeding COPD/obstructive  sleep apnea continue with nebulizer treatments, BiPAP and prednisone Diabetes mellitus type 2, uncontrolled due to D5 water start Levemir and continue insulin sliding scale Anemia of chronic disease hyperlipidemia Recent  subcutaneous hematoma Hypernatremia resolved Hyperkalemia  Lung  masses followup after discharge Encephalopathy; improving  Plan  Continue with Oxymizer ABGs in a.m. Code Status: Full  Family Communication: At bedside    DVT Prophylaxis   SCDs   Carron Curie M.D on 10/23/2013 at 3:07 PM

## 2013-10-24 LAB — BLOOD GAS, ARTERIAL
ACID-BASE EXCESS: 9.8 mmol/L — AB (ref 0.0–2.0)
BICARBONATE: 34.4 meq/L — AB (ref 20.0–24.0)
Delivery systems: POSITIVE
EXPIRATORY PAP: 6
FIO2: 0.4 %
Inspiratory PAP: 12
O2 SAT: 97.6 %
PATIENT TEMPERATURE: 98.6
TCO2: 36 mmol/L (ref 0–100)
pCO2 arterial: 51.8 mmHg — ABNORMAL HIGH (ref 35.0–45.0)
pH, Arterial: 7.438 (ref 7.350–7.450)
pO2, Arterial: 95.3 mmHg (ref 80.0–100.0)

## 2013-10-24 NOTE — Progress Notes (Signed)
[facility]                                                                                              Progress note     Patient Demographics  Breanna King, is a 73 y.o. female  ZOX:[id number]  [id number]  DOB - [birth year]  Admit date - 10/13/2013  Admitting Physician Carron Curie, MD  Outpatient Primary MD for the patient is No PCP Per Patient  LOS - 11   Chief complaint Respiratory failure Pneumonia         Subjective:   Breanna King awake and alert,   Objective:   Vital signs  Temperature 97.5 Heart rate 76 Respiratory rate 22 Blood pressure 122/64 Pulse ox 97%    Exam Alert and awake Breanna King,Breanna King Supple Neck,No JVD, No cervical lymphadenopathy appriciated.  Symmetrical Chest wall movement, decreased breath sounds bilaterally with mild rhonchi RRR,No Gallops,Rubs or new Murmurs, No Parasternal Heave +ve B.Sounds, Abd Soft, Non tender, No organomegaly appriciated, No rebound - guarding or rigidity. No Cyanosis, Clubbing +2 edema,    I&Os 1240/540    Data Review   CBC  Recent Labs Lab 10/19/13 1219 10/21/13 0534  WBC 11.9* 12.9*  HGB 7.2* 11.0*  HCT 23.4* 33.8*  PLT 291 245  MCV 114.1* 100.3*  MCH 35.1* 32.6  MCHC 30.8 32.5  RDW 16.0* 20.9*    Chemistries   Recent Labs Lab 10/18/13 0736 10/21/13 0534  NA 147 143  K 4.2 3.8  CL 104 98  CO2 33* 36*  GLUCOSE 54* 105*  BUN 16 11  CREATININE 0.98 1.00  CALCIUM 9.0 9.2   ------------------------------------------------------------------------------------------------------------------ CrCl is unknown because there is no height on file for the current visit. ------------------------------------------------------------------------------------------------------------------ No results found for this basename: HGBA1C,  in the last 72  hours ------------------------------------------------------------------------------------------------------------------ No results found for this basename: CHOL, HDL, LDLCALC, TRIG, CHOLHDL, LDLDIRECT,  in the last 72 hours ------------------------------------------------------------------------------------------------------------------ No results found for this basename: TSH, T4TOTAL, FREET3, T3FREE, THYROIDAB,  in the last 72 hours ------------------------------------------------------------------------------------------------------------------ No results found for this basename: VITAMINB12, FOLATE, FERRITIN, TIBC, IRON, RETICCTPCT,  in the last 72 hours  Coagulation profile No results found for this basename: INR, PROTIME,  in the last 168 hours  No results found for this basename: DDIMER,  in the last 72 hours  Cardiac Enzymes No results found for this basename: CK, CKMB, TROPONINI, MYOGLOBIN,  in the last 168 hours ------------------------------------------------------------------------------------------------------------------ No components found with this basename: POCBNP,   Micro Results No results found for this or any previous visit (from the past 240 hour(s)).     Assessment & Plan   Respiratory failure on 4 L Oxymizer during the day and BiPAP at night, and when necessary, Community acquired pneumonia with bilateral infiltrates/masses on Zosyn History of GI bleed not a candidate for endoscopic evaluation off blood thinners History of atrial fibrillation with normal sinus rhythm at this time not on chronic anticoagulation History of coronary artery disease status post 2 stent placements last one was 2007 off Plavix due to bleeding COPD/obstructive sleep apnea continue with nebulizer treatments, BiPAP and prednisone Diabetes  mellitus type 2, uncontrolled due to D5 water start Levemir and continue insulin sliding scale Anemia of chronic disease hyperlipidemia Recent  subcutaneous hematoma Hypernatremia resolved Hyperkalemia  Lung  masses followup after discharge Encephalopathy; improving  Plan  Continue with Oxymizer and try to decrease to 3 L a minute  Code Status: Full  Family Communication: At bedside    DVT Prophylaxis   SCDs   Carron Curie M.D on 10/24/2013 at 12:15 PM

## 2013-10-25 NOTE — Progress Notes (Signed)
[facility]                                                                                              Progress note     Patient Demographics  Breanna King, is a 73 y.o. female  [id number]  [id number]  DOB - [birth year]  Admit date - 10/13/2013  Admitting Physician Carron Curie, MD  Outpatient Primary MD for the patient is No PCP Per Patient  LOS - 12  Chief complaint Respiratory failure Pneumonia      Subjective:   Breanna King awake and alert and talkative. Wants to now when she can go home. Denies any acute pain complaints.  Objective:   Vital signs  TMax 98.6 Heart rate 72-86 Respiratory rate 16-20 Blood pressure 100/60-122/60 Pulse Ox: 90%-99% I/O: +160 BG: 125-342  Exam GEN: Alert and awake HEENT: [place]/AT,PERRLA EOMI bilaterally, +gag reflex NECK: Supple, No JVD, or LAD LUNGS: Symmetrical Chest wall movement, decreased breath sounds bilaterally CV: Normal S1, S2, no m/c/r. No Parasternal Heave ABD: BS x 4, S/NT/ND, No organomegaly, rebound, guarding or rigidity. EXT: No Cyanosis, Clubbing. 1+ peripheral edema, Pulses 2+ NEURO: Grossly intact  Data Review   CBC  Recent Labs Lab 10/19/13 1219 10/21/13 0534  WBC 11.9* 12.9*  HGB 7.2* 11.0*  HCT 23.4* 33.8*  PLT 291 245  MCV 114.1* 100.3*  MCH 35.1* 32.6  MCHC 30.8 32.5  RDW 16.0* 20.9*    Chemistries   Recent Labs Lab 10/21/13 0534  NA 143  K 3.8  CL 98  CO2 36*  GLUCOSE 105*  BUN 11  CREATININE 1.00  CALCIUM 9.2   ------------------------------------------------------------------------------------------------------------------ CrCl is unknown because there is no height on file for the current visit. ------------------------------------------------------------------------------------------------------------------ No results found for this basename: HGBA1C,  in the last 72  hours ------------------------------------------------------------------------------------------------------------------ No results found for this basename: CHOL, HDL, LDLCALC, TRIG, CHOLHDL, LDLDIRECT,  in the last 72 hours ------------------------------------------------------------------------------------------------------------------ No results found for this basename: TSH, T4TOTAL, FREET3, T3FREE, THYROIDAB,  in the last 72 hours ------------------------------------------------------------------------------------------------------------------ No results found for this basename: VITAMINB12, FOLATE, FERRITIN, TIBC, IRON, RETICCTPCT,  in the last 72 hours  Coagulation profile No results found for this basename: INR, PROTIME,  in the last 168 hours  No results found for this basename: DDIMER,  in the last 72 hours  Cardiac Enzymes No results found for this basename: CK, CKMB, TROPONINI, MYOGLOBIN,  in the last 168 hours ------------------------------------------------------------------------------------------------------------------ No components found with this basename: POCBNP,   Micro Results No results found for this or any previous visit (from the past 240 hour(s)).   Assessment & Plan   Acute Respiratory Failure: previously on 4 L Oxymizer during the day (9/7 and now 3L) continue BiPAP at night/prn  CAP with bilateral infiltrates/ Lungmasses: -Zosyn, Day 12 to expire 09/15 -continue steroids and BD -will need to schedule outpt pulm appt for abnormal CT of Chest +masses  Toxic Encephalopathy: -improving toward baseline, continue active mgmt of treatment plan  History of GI bleed/Anemia of chronic disease: -not a candidate for endoscopic evaluation off blood thinners  History of atrial fibrillation, now  normal sinus rhythm -not an anticoagulation candidate given h/o GI bleed  History of coronary artery disease status post 2 stent placements last one was 2007   COPD/obstructive sleep apnea  Diabetes mellitus type 2, uncontrolled: -improving toward normoglycemia; continue to titrate insulin to goal and tolerance  Hyperkalemia/Hypernatremia: -will need to recheck, last lab draw 9/4  Code Status: Full  DVT Prophylaxis   SCDs  Berton Lan M.D on 10/25/2013 at 6:58 PM

## 2013-10-26 NOTE — Progress Notes (Signed)
[facility]                                                                                              Progress note     Patient Demographics  Breanna King, is a 73 y.o. female  [id number]  [id number]  DOB - [birth year]  Admit date - 10/13/2013  Admitting Physician Carron Curie, MD  Outpatient Primary MD for the patient is No PCP Per Patient  LOS - 13  Chief complaint Respiratory failure Pneumonia      Subjective:   Breanna King awake and alert and talkative. Wants to now when she can go home. Denies any acute pain complaints.  Objective:   Vital signs  TMax:    98.1 Heart rate:   70-87 Respiratory rate:  16-20 Blood pressure:  109/63-148/86 Pulse Ox:    95-100% on Oxymizer 3L I/O:     2220/2000 BG:     153-259 BM:    3 PO Intake:   100% Dys II w/NTL  Exam GEN: Alert and awake HEENT: [place]/AT,PERRLA EOMI bilaterally, +gag reflex NECK: Supple, No JVD, or LAD LUNGS: Symmetrical Chest wall movement, decreased breath sounds bilaterally CV: Normal S1, S2, no m/c/r. No Parasternal Heave ABD: BS x 4, S/NT/ND, No organomegaly, rebound, guarding or rigidity. EXT: No Cyanosis, Clubbing. 1+ peripheral edema, Pulses 2+ NEURO: Grossly intact  Data Review   CBC  Recent Labs Lab 10/21/13 0534  WBC 12.9*  HGB 11.0*  HCT 33.8*  PLT 245  MCV 100.3*  MCH 32.6  MCHC 32.5  RDW 20.9*    Chemistries   Recent Labs Lab 10/21/13 0534  NA 143  K 3.8  CL 98  CO2 36*  GLUCOSE 105*  BUN 11  CREATININE 1.00  CALCIUM 9.2   ------------------------------------------------------------------------------------------------------------------ CrCl is unknown because there is no height on file for the current visit. ------------------------------------------------------------------------------------------------------------------ No results found for this basename: HGBA1C,  in  the last 72 hours ------------------------------------------------------------------------------------------------------------------ No results found for this basename: CHOL, HDL, LDLCALC, TRIG, CHOLHDL, LDLDIRECT,  in the last 72 hours ------------------------------------------------------------------------------------------------------------------ No results found for this basename: TSH, T4TOTAL, FREET3, T3FREE, THYROIDAB,  in the last 72 hours ------------------------------------------------------------------------------------------------------------------ No results found for this basename: VITAMINB12, FOLATE, FERRITIN, TIBC, IRON, RETICCTPCT,  in the last 72 hours  Coagulation profile No results found for this basename: INR, PROTIME,  in the last 168 hours  No results found for this basename: DDIMER,  in the last 72 hours  Cardiac Enzymes No results found for this basename: CK, CKMB, TROPONINI, MYOGLOBIN,  in the last 168 hours ------------------------------------------------------------------------------------------------------------------ No components found with this basename: POCBNP,   Micro Results No results found for this or any previous visit (from the past 240 hour(s)).   Assessment & Plan   Acute Respiratory Failure: previously on 4 L Oxymizer during the day (9/7 and now 3L) continue BiPAP at night/prn  CAP with bilateral infiltrates/Lungmasses/Advanced COPD/Obstructive sleep apnea: -Zosyn, to expire 09/15 (complete 14 day course) -continue steroids, Duonebs, ICS and BD -will need to schedule outpt pulm appt for abnormal CT of Chest +masses (repeat in 4-6weeks) -compliant with  wearing her BiPAP at night: 12/6 at 40%; R-10  Toxic Encephalopathy:resolved -appears at baseline, continue active mgmt of treatment plan -monitor mental status daily  History of GI bleed/Anemia of chronic disease: -not a candidate for endoscopic evaluation off blood thinners -no active  bleeding, recheck CBC 9/14 -continue Protonix  BID x 6-8 weeks  History of atrial fibrillation, now normal sinus rhythm -not an anticoagulation candidate given h/o GI bleed -continue beta-blocker for rate control and monitor or bronchospasms  History of coronary artery disease status post 2 stent placements last one was 2007  -Plavix and ASA d/c given GI bleed; continue beta-blocker, ACE-I and Statin  Diabetes mellitus type 2, uncontrolled: -improving toward normoglycemia; continue to titrate insulin to goal and tolerance  Hyperkalemia/Hypernatremia: -resolved, monitor intermittently  PCM/Generalized Weakness: -encourage participation in PT/OT/ST; UTC during the day encouraged as pt is content with staying in the bed all day -continue RD input, protein and vitamin supplementation  Code Status: Full  GI Prophylaxis/Gastritis:Protonix and Probitic DVT Prophylaxis:    SCDs  Howis Sharyon Medicus M.D on 10/26/2013 at 7:36 PM

## 2013-10-27 NOTE — Progress Notes (Signed)
[facility]                                                                                              Progress note     Patient Demographics  Breanna King, is a 73 y.o. female  [id number]  [id number]  DOB - 13-Jun-1940  Admit date - 10/13/2013  Admitting Physician Carron Curie, MD  Outpatient Primary MD for the patient is No PCP Per Patient  LOS - 14  Chief complaint Respiratory failure Pneumonia      Subjective:   Breanna King awake and alert and talkative. Wants to now when she can go home. Denies any acute pain complaints.  Objective:   Vital signs  TMax: 98.54F  Heart rate: 70-80  Respiratory rate: 18-20  Blood pressure: 136/60-156/80  Pulse Ox: 93-100% on Oxymizer 3L  I/O: 1640/1650  BG: 146-239  BM: 3  PO Intake: 100% Dys II w/NTL   Exam GEN: Alert and awake HEENT: [place]/AT,PERRLA EOMI bilaterally, +gag reflex NECK: Supple, No JVD, or LAD LUNGS: Symmetrical Chest wall movement, decreased breath sounds bilaterally CV: Normal S1, S2, no m/c/r. No Parasternal Heave ABD: BS x 4, S/NT/ND, No organomegaly, rebound, guarding or rigidity. EXT: No Cyanosis, Clubbing. 1+ peripheral edema, Pulses 2+ NEURO: Grossly intact  Data Review   CBC  Recent Labs Lab 10/21/13 0534  WBC 12.9*  HGB 11.0*  HCT 33.8*  PLT 245  MCV 100.3*  MCH 32.6  MCHC 32.5  RDW 20.9*    Chemistries   Recent Labs Lab 10/21/13 0534  NA 143  K 3.8  CL 98  CO2 36*  GLUCOSE 105*  BUN 11  CREATININE 1.00  CALCIUM 9.2   ------------------------------------------------------------------------------------------------------------------ CrCl is unknown because there is no height on file for the current visit. ------------------------------------------------------------------------------------------------------------------ No results found for this basename: HGBA1C,  in the last 72  hours ------------------------------------------------------------------------------------------------------------------ No results found for this basename: CHOL, HDL, LDLCALC, TRIG, CHOLHDL, LDLDIRECT,  in the last 72 hours ------------------------------------------------------------------------------------------------------------------ No results found for this basename: TSH, T4TOTAL, FREET3, T3FREE, THYROIDAB,  in the last 72 hours ------------------------------------------------------------------------------------------------------------------ No results found for this basename: VITAMINB12, FOLATE, FERRITIN, TIBC, IRON, RETICCTPCT,  in the last 72 hours  Coagulation profile No results found for this basename: INR, PROTIME,  in the last 168 hours  No results found for this basename: DDIMER,  in the last 72 hours  Cardiac Enzymes No results found for this basename: CK, CKMB, TROPONINI, MYOGLOBIN,  in the last 168 hours ------------------------------------------------------------------------------------------------------------------ No components found with this basename: POCBNP,   Micro Results No results found for this or any previous visit (from the past 240 hour(s)).   Assessment & Plan   Acute Respiratory Failure: previously on 4 L Oxymizer during the day (9/7 and now 3L) continue BiPAP at night/prn  CAP with bilateral infiltrates/Lungmasses/Advanced COPD/Obstructive sleep apnea: -Zosyn, to expire 09/15 (complete 14 day course) -continue steroids, Duonebs, ICS and BD -will need to schedule outpt pulm appt for abnormal CT of Chest +masses (repeat in 4-6weeks) -compliant with wearing her BiPAP at night: 12/6 at 40%; R-10  Toxic Encephalopathy:resolved -appears at  baseline, continue active mgmt of treatment plan -monitor mental status daily; no confusion noted today  History of GI bleed/Anemia of chronic disease: -not a candidate for endoscopic evaluation off blood  thinners -no active bleeding, recheck CBC 9/14 -continue Protonix  BID x 6-8 weeks  History of atrial fibrillation, now normal sinus rhythm -not an anticoagulation candidate given h/o GI bleed -continue beta-blocker for rate control and monitor or bronchospasms  History of coronary artery disease status post 2 stent placements last one was 2007  -Plavix and ASA d/c given GI bleed; continue beta-blocker, ACE-I and Statin -f/u with cardiology post discharge  Diabetes mellitus type 2, uncontrolled: -improving toward normoglycemia; continue to titrate insulin to goal and tolerance -pt on Januvia and Levemir 5U qam; ACE-I for renal protection and Statin therapy continued  Hyperkalemia/Hypernatremia: -resolved, monitor intermittently, recheck level 9/14  PCM/Generalized Weakness: -encourage participation in PT/OT/ST; UTC during the day encouraged as pt is content with staying in the bed all day; pt was able to sit up for lunch w/o resp distress, dizziness, worsening SOA or CP. Continue to increase activity. -continue RD input, protein and vitamin supplementation  Code Status: Full  GI Prophylaxis/Gastritis:Protonix and Probitic DVT Prophylaxis:    SCDs  Howis Sharyon Medicus M.D on 10/27/2013 at 11:33 PM

## 2013-10-28 NOTE — Progress Notes (Signed)
[facility]                                                                                              Progress note     Patient Demographics  Breanna King, is a 73 y.o. female  [id number]  [id number]  DOB - [birth year]  Admit date - 10/13/2013  Admitting Physician Carron Curie, MD  Outpatient Primary MD for the patient is No PCP Per Patient  LOS - 15  Chief complaint Respiratory failure Pneumonia      Subjective:   Breanna King awake and alert. Denies any acute pain complaints or N/V/D, SOA or abdominal pain.  Objective:   Vital signs  TMax 99.83F Heart rate 74-85 Respiratory rate 19-22 Blood pressure 118/71-144/75 Pulse Ox: 97%-100% I/O: 1630/1851 BG: 144-262  Exam GEN: Alert and awake HEENT: [place]/AT,PERRLA EOMI bilaterally, +gag reflex NECK: Supple, No JVD, or LAD LUNGS: Symmetrical Chest wall movement, decreased breath sounds bilaterally CV: Normal S1, S2, no m/c/r. No Parasternal Heave ABD: BS x 4, S/NT/ND, No organomegaly, rebound, guarding or rigidity. EXT: No Cyanosis, Clubbing. 1+ peripheral edema, Pulses 2+ NEURO: Grossly intact  Data Review   CBC No results found for this basename: WBC, HGB, HCT, PLT, MCV, MCH, MCHC, RDW, NEUTRABS, LYMPHSABS, MONOABS, EOSABS, BASOSABS, BANDABS, BANDSABD,  in the last 168 hours  Chemistries  No results found for this basename: NA, K, CL, CO2, GLUCOSE, BUN, CREATININE, GFRCGP, CALCIUM, MG, AST, ALT, ALKPHOS, BILITOT,  in the last 168 hours ------------------------------------------------------------------------------------------------------------------ CrCl is unknown because there is no height on file for the current visit. ------------------------------------------------------------------------------------------------------------------ No results found for this basename: HGBA1C,  in the last 72  hours ------------------------------------------------------------------------------------------------------------------ No results found for this basename: CHOL, HDL, LDLCALC, TRIG, CHOLHDL, LDLDIRECT,  in the last 72 hours ------------------------------------------------------------------------------------------------------------------ No results found for this basename: TSH, T4TOTAL, FREET3, T3FREE, THYROIDAB,  in the last 72 hours ------------------------------------------------------------------------------------------------------------------ No results found for this basename: VITAMINB12, FOLATE, FERRITIN, TIBC, IRON, RETICCTPCT,  in the last 72 hours  Coagulation profile No results found for this basename: INR, PROTIME,  in the last 168 hours  No results found for this basename: DDIMER,  in the last 72 hours  Cardiac Enzymes No results found for this basename: CK, CKMB, TROPONINI, MYOGLOBIN,  in the last 168 hours ------------------------------------------------------------------------------------------------------------------ No components found with this basename: POCBNP,   Micro Results No results found for this or any previous visit (from the past 240 hour(s)).   Assessment & Plan   Acute Respiratory Failure: previously on 4 L Oxymizer during the day (9/7 and now 3L) continue BiPAP at night/prn  CAP with bilateral infiltrates/Lungmasses/Advanced COPD/Obstructive sleep apnea:  -Zosyn, to expire 09/15 (complete 14 day course)  -continue steroids, Duonebs, ICS and BD  -will need to schedule outpt pulm appt for abnormal CT of Chest +masses (repeat in 4-6weeks)  -compliant with wearing her BiPAP at night: 12/6 at 40%; R-10   Toxic Encephalopathy:resolved  -appears at baseline, continue active mgmt of treatment plan  -monitor mental status daily; no confusion noted today   History of GI bleed/Anemia of chronic disease:  -not a candidate  for endoscopic evaluation off blood  thinners  -no active bleeding, recheck CBC 9/14  -continue Protonix  BID x 6-8 weeks   History of atrial fibrillation, now normal sinus rhythm  -not an anticoagulation candidate given h/o GI bleed  -continue beta-blocker for rate control and monitor or bronchospasms   History of coronary artery disease status post 2 stent placements last one was 2007  -Plavix and ASA d/c given GI bleed; continue beta-blocker, ACE-I and Statin  -f/u with cardiology post discharge   Diabetes mellitus type 2, uncontrolled:  -slightly above goal, monitor trends over the next few days, if still elevated increase Levemir to 10U qam; continue to titrate insulin to goal and tolerance  -pt on Januvia and Levemir 5U qam; ACE-I for renal protection and Statin therapy continued   Hyperkalemia/Hypernatremia:  -resolved, monitor intermittently, recheck level 9/14   PCM/Generalized Weakness:  -encourage participation in PT/OT/ST; UTC more, but still requires encouragement. -continue RD input, protein and vitamin supplementation   Code Status: Full  GI Prophylaxis/Gastritis:Protonix and Probitic  DVT Prophylaxis: SCDs  Howis Sharyon Medicus M.D on 10/28/2013 at 5:38 PM

## 2013-10-29 NOTE — Progress Notes (Signed)
[facility]                                                                                              Progress note     Patient Demographics  Breanna King, is a 73 y.o. female  ZOX:[id number]  [id number]  DOB - 12/21/1940  Admit date - 10/13/2013  Admitting Physician Carron Curie, MD  Outpatient Primary MD for the patient is No PCP Per Patient  LOS - 16  Chief complaint Respiratory failure Pneumonia      Subjective:   Breanna King awake and alert. Denies any acute pain complaints or N/V/D, SOA or abdominal pain.   Objective:   Vital signs  TMax 98.56F  Heart rate 71-95  Respiratory rate 16-20  Blood pressure 92/64-132/66  Pulse Ox: 96-99%  I/O: 2390/1127  BM: 3 PO Intake: 75-100%  Exam GEN: Alert and awake HEENT: [place]/AT,PERRLA EOMI bilaterally, +gag reflex NECK: Supple, No JVD, or LAD LUNGS: Symmetrical Chest wall movement, decreased breath sounds bilaterally CV: Normal S1, S2, no m/c/r. No Parasternal Heave ABD: BS x 4, S/NT/ND, No organomegaly, rebound, guarding or rigidity. EXT: No Cyanosis, Clubbing. 1+ peripheral edema, Pulses 2+ NEURO: Grossly intact  Data Review   CBC No results found for this basename: WBC, HGB, HCT, PLT, MCV, MCH, MCHC, RDW, NEUTRABS, LYMPHSABS, MONOABS, EOSABS, BASOSABS, BANDABS, BANDSABD,  in the last 168 hours  Chemistries  No results found for this basename: NA, K, CL, CO2, GLUCOSE, BUN, CREATININE, GFRCGP, CALCIUM, MG, AST, ALT, ALKPHOS, BILITOT,  in the last 168 hours ------------------------------------------------------------------------------------------------------------------ CrCl is unknown because there is no height on file for the current visit. ------------------------------------------------------------------------------------------------------------------ No results found for this basename: HGBA1C,  in the last 72  hours ------------------------------------------------------------------------------------------------------------------ No results found for this basename: CHOL, HDL, LDLCALC, TRIG, CHOLHDL, LDLDIRECT,  in the last 72 hours ------------------------------------------------------------------------------------------------------------------ No results found for this basename: TSH, T4TOTAL, FREET3, T3FREE, THYROIDAB,  in the last 72 hours ------------------------------------------------------------------------------------------------------------------ No results found for this basename: VITAMINB12, FOLATE, FERRITIN, TIBC, IRON, RETICCTPCT,  in the last 72 hours  Coagulation profile No results found for this basename: INR, PROTIME,  in the last 168 hours  No results found for this basename: DDIMER,  in the last 72 hours  Cardiac Enzymes No results found for this basename: CK, CKMB, TROPONINI, MYOGLOBIN,  in the last 168 hours ------------------------------------------------------------------------------------------------------------------ No components found with this basename: POCBNP,   Micro Results No results found for this or any previous visit (from the past 240 hour(s)).   Assessment & Plan  NOTE: Continuing with active treatment plan below  Acute Respiratory Failure: 3L Oxymizer and continue BiPAP at night/prn  CAP with bilateral infiltrates/Lungmasses/Advanced COPD/Obstructive sleep apnea:  -Zosyn, to expire 09/15 (complete 14 day course)  -continue steroids, Duonebs, ICS and BD  -will need to schedule outpt pulm appt for abnormal CT of Chest +masses (repeat in 4-6weeks)  -compliant with wearing her BiPAP at night: 12/6 at 40%; R-10   Toxic Encephalopathy:resolved  -appears at baseline, continue active mgmt of treatment plan  -monitor mental status daily; no confusion noted today   History of GI  bleed/Anemia of chronic disease:  -not a candidate for endoscopic evaluation off  blood thinners  -no active bleeding, recheck CBC 9/14  -continue Protonix  BID x 6-8 weeks   History of atrial fibrillation, now normal sinus rhythm  -not an anticoagulation candidate given h/o GI bleed  -continue beta-blocker for rate control and monitor or bronchospasms   History of coronary artery disease status post 2 stent placements last one was 2007  -Plavix and ASA d/c given GI bleed; continue beta-blocker, ACE-I and Statin  -f/u with cardiology post discharge   Diabetes mellitus type 2, uncontrolled:  -slightly above goal, monitor trends over the next few days, if still elevated increase Levemir to 10U qam; continue to titrate insulin to goal and tolerance  -pt on Januvia and Levemir 5U qam; ACE-I for renal protection and Statin therapy continued   Hyperkalemia/Hypernatremia:  -resolved, monitor intermittently, recheck level 9/14   PCM/Generalized Weakness:  -encourage participation in PT/OT/ST; UTC more, but still requires encouragement.  -continue RD input, protein and vitamin supplementation   Code Status: Full  GI Prophylaxis/Gastritis:Protonix and Probitic  DVT Prophylaxis: SCDs  Howis Sharyon Medicus M.D on 10/29/2013 at 3:53 PM

## 2013-10-31 ENCOUNTER — Other Ambulatory Visit (HOSPITAL_COMMUNITY): Payer: Self-pay

## 2013-10-31 LAB — PHOSPHORUS: Phosphorus: 3.2 mg/dL (ref 2.3–4.6)

## 2013-10-31 LAB — COMPREHENSIVE METABOLIC PANEL
ALBUMIN: 2.3 g/dL — AB (ref 3.5–5.2)
ALK PHOS: 58 U/L (ref 39–117)
ALT: 22 U/L (ref 0–35)
AST: 16 U/L (ref 0–37)
Anion gap: 13 (ref 5–15)
BUN: 10 mg/dL (ref 6–23)
CO2: 33 meq/L — AB (ref 19–32)
Calcium: 9.6 mg/dL (ref 8.4–10.5)
Chloride: 97 mEq/L (ref 96–112)
Creatinine, Ser: 0.77 mg/dL (ref 0.50–1.10)
GFR calc Af Amer: >90 mL/min (ref >90)
GFR, EST NON AFRICAN AMERICAN: 82 mL/min — AB (ref >90)
Glucose, Bld: 132 mg/dL — ABNORMAL HIGH (ref 70–99)
POTASSIUM: 3 meq/L — AB (ref 3.7–5.3)
Sodium: 143 mEq/L (ref 137–147)
Total Bilirubin: <0.2 mg/dL — ABNORMAL LOW (ref 0.3–1.2)
Total Protein: 6.7 g/dL (ref 6.0–8.3)

## 2013-10-31 LAB — MAGNESIUM: Magnesium: 1.7 mg/dL (ref 1.5–2.5)

## 2013-10-31 LAB — TSH: TSH: 2.99 u[IU]/mL (ref 0.350–4.500)

## 2013-10-31 LAB — VITAMIN B12: Vitamin B-12: 509 pg/mL (ref 211–911)

## 2013-10-31 LAB — HEMOGLOBIN A1C
Hgb A1c MFr Bld: 7.2 % — ABNORMAL HIGH (ref <5.7)
Mean Plasma Glucose: 160 mg/dL — ABNORMAL HIGH (ref <117)

## 2013-10-31 NOTE — Progress Notes (Signed)
[facility]                                                                                              Progress note     Patient Demographics  Breanna King, is a 73 y.o. female  ZOX:[id number]  [id number]  DOB - 22-Jun-1940  Admit date - 10/13/2013  Admitting Physician Carron Curie, MD  Outpatient Primary MD for the patient is No PCP Per Patient  LOS - 18  Chief complaint Respiratory failure Pneumonia      Subjective:   Breanna King awake and alert. Denies any acute pain complaints or N/V/D, SOA or abdominal pain.   Objective:   Vital signs  TMax 98.52F  Heart rate 72-94  Respiratory rate 18-20  Blood pressure 116/70-122/60  Pulse Ox: 90-100%  I/O: 1360/855 BM: 0 recorded PO Intake: 75-100%  Exam GEN: Alert and awake HEENT: [place]/AT,PERRLA EOMI bilaterally, +gag reflex NECK: Supple, No JVD, or LAD LUNGS: Symmetrical Chest wall movement, decreased breath sounds bilaterally CV: Normal S1, S2, no m/c/r. No Parasternal Heave ABD: BS x 4, S/NT/ND, No organomegaly, rebound, guarding or rigidity. EXT: No Cyanosis, Clubbing. 1+ peripheral edema, Pulses 2+ NEURO: Grossly intact  Data Review   CBC No results found for this basename: WBC, HGB, HCT, PLT, MCV, MCH, MCHC, RDW, NEUTRABS, LYMPHSABS, MONOABS, EOSABS, BASOSABS, BANDABS, BANDSABD,  in the last 168 hours  Chemistries  No results found for this basename: NA, K, CL, CO2, GLUCOSE, BUN, CREATININE, GFRCGP, CALCIUM, MG, AST, ALT, ALKPHOS, BILITOT,  in the last 168 hours ------------------------------------------------------------------------------------------------------------------ CrCl is unknown because there is no height on file for the current visit. ------------------------------------------------------------------------------------------------------------------ No results found for this basename: HGBA1C,  in the last  72 hours ------------------------------------------------------------------------------------------------------------------ No results found for this basename: CHOL, HDL, LDLCALC, TRIG, CHOLHDL, LDLDIRECT,  in the last 72 hours ------------------------------------------------------------------------------------------------------------------ No results found for this basename: TSH, T4TOTAL, FREET3, T3FREE, THYROIDAB,  in the last 72 hours ------------------------------------------------------------------------------------------------------------------ No results found for this basename: VITAMINB12, FOLATE, FERRITIN, TIBC, IRON, RETICCTPCT,  in the last 72 hours  Coagulation profile No results found for this basename: INR, PROTIME,  in the last 168 hours  No results found for this basename: DDIMER,  in the last 72 hours  Cardiac Enzymes No results found for this basename: CK, CKMB, TROPONINI, MYOGLOBIN,  in the last 168 hours ------------------------------------------------------------------------------------------------------------------ No components found with this basename: POCBNP,   Micro Results No results found for this or any previous visit (from the past 240 hour(s)).   Assessment & Plan  NOTE: Continuing with active treatment plan below  Acute Respiratory Failure: 3L Oxymizer and continue BiPAP at night/prn  CAP with bilateral infiltrates/Lungmasses/Advanced COPD/Obstructive sleep apnea:  -Zosyn, to expire 09/15 (complete 14 day course)  -continue steroids, Duonebs, ICS and BD  -will need to schedule outpt pulm appt for abnormal CT of Chest +masses (repeat in 4-6weeks)  -compliant with wearing her BiPAP at night: 12/6 at 40%; R-10   Toxic Encephalopathy:resolved  -appears at baseline, continue active mgmt of treatment plan  -monitor mental status daily; no confusion noted today   History of GI  bleed/Anemia of chronic disease:  -not a candidate for endoscopic evaluation  off blood thinners  -no active bleeding, recheck CBC 9/14  -continue Protonix  BID x 6-8 weeks   History of atrial fibrillation, now normal sinus rhythm  -not an anticoagulation candidate given h/o GI bleed  -continue beta-blocker for rate control and monitor or bronchospasms   History of coronary artery disease status post 2 stent placements last one was 2007  -Plavix and ASA d/c given GI bleed; continue beta-blocker, ACE-I and Statin  -f/u with cardiology post discharge   Diabetes mellitus type 2, uncontrolled:  -slightly above goal, monitor trends over the next few days, if still elevated increase Levemir to 10U qam; continue to titrate insulin to goal and tolerance  -pt on Januvia and Levemir 5U qam; ACE-I for renal protection and Statin therapy continued   Hyperkalemia/Hypernatremia:  -resolved, monitor intermittently, recheck level 9/14   PCM/Generalized Weakness:  -encourage participation in PT/OT/ST; UTC more, but still requires encouragement.  -continue RD input, protein and vitamin supplementation   Code Status: Full  GI Prophylaxis/Gastritis:Protonix and Probitic  DVT Prophylaxis: SCDs  Howis Sharyon Medicus M.D on 10/31/2013 at 4:39 AM

## 2013-10-31 NOTE — Progress Notes (Addendum)
[facility]                                                                                              Progress note     Patient Demographics  Breanna King, is a 73 y.o. female  ZOX:[id number]  [id number]  DOB - [birth year]  Admit date - 10/13/2013  Admitting Physician Breanna Curie, MD  Outpatient Primary MD for the patient is No PCP Per Patient  LOS - 18  Chief complaint Respiratory failure Pneumonia      Subjective:   Breanna King awake and alert. Denies any acute pain complaints or N/V/D, SOA or abdominal pain.   Objective:   Vital signs  TMax 98.56F  Heart rate 72-97  Respiratory rate 18-20  Blood pressure 122/66-136/72  Pulse Ox: 94-98%  BM: 0 recorded PO Intake: 75-100% BG: 158-196  Exam GEN: Alert and awake HEENT: [place]/AT,PERRLA EOMI bilaterally, +gag reflex NECK: Supple, No JVD, or LAD LUNGS: Symmetrical Chest wall movement, decreased breath sounds bilaterally CV: Normal S1, S2, no m/c/r. No Parasternal Heave ABD: BS x 4, S/NT/ND, No organomegaly, rebound, guarding or rigidity. EXT: No Cyanosis, Clubbing. 1+ peripheral edema, Pulses 2+ NEURO: Grossly intact  Data Review   CBC No results found for this basename: WBC, HGB, HCT, PLT, MCV, MCH, MCHC, RDW, NEUTRABS, LYMPHSABS, MONOABS, EOSABS, BASOSABS, BANDABS, BANDSABD,  in the last 168 hours  Chemistries   Recent Labs Lab 10/31/13 1230  NA 143  K 3.0*  CL 97  CO2 33*  GLUCOSE 132*  BUN 10  CREATININE 0.77  CALCIUM 9.6  MG 1.7  AST 16  ALT 22  ALKPHOS 58  BILITOT <0.2*   ------------------------------------------------------------------------------------------------------------------ CrCl is unknown because there is no height on file for the current visit. ------------------------------------------------------------------------------------------------------------------ No results found for this  basename: HGBA1C,  in the last 72 hours ------------------------------------------------------------------------------------------------------------------ No results found for this basename: CHOL, HDL, LDLCALC, TRIG, CHOLHDL, LDLDIRECT,  in the last 72 hours ------------------------------------------------------------------------------------------------------------------  Recent Labs  10/31/13 1230  TSH 2.990   ------------------------------------------------------------------------------------------------------------------  Recent Labs  10/31/13 1230  VITAMINB12 509    Coagulation profile No results found for this basename: INR, PROTIME,  in the last 168 hours  No results found for this basename: DDIMER,  in the last 72 hours  Cardiac Enzymes No results found for this basename: CK, CKMB, TROPONINI, MYOGLOBIN,  in the last 168 hours ------------------------------------------------------------------------------------------------------------------ No components found with this basename: POCBNP,   Micro Results No results found for this or any previous visit (from the past 240 hour(s)).   Assessment & Plan   Acute Respiratory Failure: 3L Oxymizer and continue BiPAP at night/prn  CAP with bilateral infiltrates with Leukocytosis/Lungmasses/Advanced COPD/Obstructive sleep apnea:  -Given Advanced COPD, orders written to titrate oxygen to keep O2Sat>90% vs 100% -Zosyn, to expire 09/15 (complete 14 day course)  -continue steroids, Duonebs, ICS and BD  -will need to schedule outpt pulm appt for abnormal CT of Chest +masses (repeat in 4-6weeks)  -compliant with wearing her BiPAP at night: 12/6 at 40%; R-10  -repeat CXR if continued hypoxia refraction  Toxic Encephalopathy:resolved  -appears  at baseline, continue active mgmt of treatment plan  -monitor mental status daily; no confusion noted today   History of GI bleed/Anemia of chronic disease/Macrocytosis:  -not a candidate for  endoscopic evaluation off blood thinners  -no active bleeding, recheck CBC in am, will also check iron panel, Vit B12 and Folate  -continue Protonix  BID x 6-8 weeks   Chronic AFIB, rate contolled/HTN, uncontrolled: -not an anticoagulation candidate given h/o GI bleed  -continue beta-blocker for rate control and monitor or bronchospasms, no bronchospasms noted, but pt on 2 beta-blockers; will d/c metoprolol and increase Coreg dose from 12.5mg  BID to  BID and monitor for hypotension; holding parameters written and will follow BP trend as pt may tolerate Toprol XL better.   Hypokalemia: -goal 4.0 now 3.0; give KCl PO x 1 and recheck in am along with mag level, replete if needed -pt on magnesiuim and potassium replacement protocol  Transaminitis, mild: -recheck CMP today, may be 2/2 statin use; follow trends prn and if continued increase >2-3x normal, recommend further workup   History of coronary artery disease status post 2 stent placements last one was 2007  -Plavix and ASA d/c given GI bleed; continue beta-blocker, ACE-I and Statin  -f/u with cardiology post discharge   Diabetes mellitus type 2, uncontrolled:  -Increase Levemir to 7U qam; continue to titrate insulin to goal and tolerance  -pt on Januvia, ACE-I for renal protection and Statin therapy continued   Hyperkalemia/Hypernatremia:  -resolved, monitor intermittently, recheck level today   PCM/Generalized Weakness:  -encourage participation in PT/OT/ST; UTC more, but still requires encouragement.  -continue RD input, protein and vitamin supplementation  -check VIT D esp with chronic prednisone use and PCM  Adjustment Disorder vs Major Depressive Disorder:  Code Status: Full  GI Prophylaxis/Gastritis:Protonix and Probitic  DVT Prophylaxis: SCDs  Howis Sharyon Medicus M.D on 10/31/2013 at 7:39 PM

## 2013-11-01 LAB — BASIC METABOLIC PANEL
ANION GAP: 12 (ref 5–15)
Anion gap: 15 (ref 5–15)
BUN: 10 mg/dL (ref 6–23)
BUN: 11 mg/dL (ref 6–23)
CALCIUM: 10 mg/dL (ref 8.4–10.5)
CO2: 31 mEq/L (ref 19–32)
CO2: 32 mEq/L (ref 19–32)
Calcium: 9.7 mg/dL (ref 8.4–10.5)
Chloride: 99 mEq/L (ref 96–112)
Chloride: 100 mEq/L (ref 96–112)
Creatinine, Ser: 0.78 mg/dL (ref 0.50–1.10)
Creatinine, Ser: 0.88 mg/dL (ref 0.50–1.10)
GFR, EST AFRICAN AMERICAN: 74 mL/min — AB (ref >90)
GFR, EST NON AFRICAN AMERICAN: 82 mL/min — AB (ref >90)
GFR, EST NON AFRICAN AMERICAN: 64 mL/min — AB (ref >90)
Glucose, Bld: 112 mg/dL — ABNORMAL HIGH (ref 70–99)
Glucose, Bld: 98 mg/dL (ref 70–99)
POTASSIUM: 3 meq/L — AB (ref 3.7–5.3)
Potassium: 4 mEq/L (ref 3.7–5.3)
SODIUM: 145 meq/L (ref 137–147)
SODIUM: 144 meq/L (ref 137–147)

## 2013-11-01 LAB — CBC
HCT: 32.1 % — ABNORMAL LOW (ref 36.0–46.0)
Hemoglobin: 10.1 g/dL — ABNORMAL LOW (ref 12.0–15.0)
MCH: 32.4 pg (ref 26.0–34.0)
MCHC: 31.5 g/dL (ref 30.0–36.0)
MCV: 102.9 fL — ABNORMAL HIGH (ref 78.0–100.0)
Platelets: 297 10*3/uL (ref 150–400)
RBC: 3.12 MIL/uL — ABNORMAL LOW (ref 3.87–5.11)
RDW: 18.2 % — AB (ref 11.5–15.5)
WBC: 10.1 10*3/uL (ref 4.0–10.5)

## 2013-11-01 LAB — VITAMIN D 25 HYDROXY (VIT D DEFICIENCY, FRACTURES): VIT D 25 HYDROXY: 78 ng/mL (ref 30–89)

## 2013-11-01 LAB — MAGNESIUM: MAGNESIUM: 1.7 mg/dL (ref 1.5–2.5)

## 2013-11-01 LAB — FOLATE RBC: RBC FOLATE: 1286 ng/mL — AB (ref >280)

## 2013-11-01 NOTE — Progress Notes (Signed)
[facility]                                                                                              Progress note     Patient Demographics  Breanna King, is a 73 y.o. female  ZOX:[id number]  [id number]  DOB - 1940/10/18  Admit date - 10/13/2013  Admitting Physician Carron Curie, MD  Outpatient Primary MD for the patient is No PCP Per Patient  LOS - 19   Chief complaint Respiratory failure Pneumonia         Subjective:   Breanna King awake and alert,   Objective:   Vital signs  Temperature 98.2 Heart rate 92 Respiratory rate when he Blood pressure 130/70 Pulse ox 95%    Exam Alert and awake [place].AT,PERRAL Supple Neck,No JVD, No cervical lymphadenopathy appriciated.  Symmetrical Chest wall movement, decreased breath sounds bilaterally with mild rhonchi RRR,No Gallops,Rubs or new Murmurs, No Parasternal Heave +ve B.Sounds, Abd Soft, Non tender, No organomegaly appriciated, No rebound - guarding or rigidity. No Cyanosis, Clubbing +1 edema,    I&Os unknown    Data Review   CBC  Recent Labs Lab 11/01/13 0728  WBC 10.1  HGB 10.1*  HCT 32.1*  PLT 297  MCV 102.9*  MCH 32.4  MCHC 31.5  RDW 18.2*    Chemistries   Recent Labs Lab 10/31/13 1230 11/01/13 0728  NA 143 145  K 3.0* 3.0*  CL 97 99  CO2 33* 31  GLUCOSE 132* 112*  BUN 10 10  CREATININE 0.77 0.78  CALCIUM 9.6 9.7  MG 1.7 1.7  AST 16  --   ALT 22  --   ALKPHOS 58  --   BILITOT <0.2*  --    ------------------------------------------------------------------------------------------------------------------ CrCl is unknown because there is no height on file for the current visit. ------------------------------------------------------------------------------------------------------------------  Recent Labs  10/31/13 1230  HGBA1C 7.2*    ------------------------------------------------------------------------------------------------------------------ No results found for this basename: CHOL, HDL, LDLCALC, TRIG, CHOLHDL, LDLDIRECT,  in the last 72 hours ------------------------------------------------------------------------------------------------------------------  Recent Labs  10/31/13 1230  TSH 2.990   ------------------------------------------------------------------------------------------------------------------  Recent Labs  10/31/13 1230  VITAMINB12 509    Coagulation profile No results found for this basename: INR, PROTIME,  in the last 168 hours  No results found for this basename: DDIMER,  in the last 72 hours  Cardiac Enzymes No results found for this basename: CK, CKMB, TROPONINI, MYOGLOBIN,  in the last 168 hours ------------------------------------------------------------------------------------------------------------------ No components found with this basename: POCBNP,   Micro Results No results found for this or any previous visit (from the past 240 hour(s)).     Assessment & Plan   Respiratory failure on 2 l/m nasocannula during the day and BiPAP when necessary, Community acquired pneumonia with bilateral infiltrates/masses off Zosyn today History of GI bleed not a candidate for endoscopic evaluation off blood thinners History of atrial fibrillation with normal sinus rhythm at this time not on chronic anticoagulation History of coronary artery disease status post 2 stent placements last one was 2007 off Plavix due to bleeding COPD/obstructive sleep apnea continue with nebulizer treatments, BiPAP and  prednisone Diabetes mellitus type 2, controlled better Anemia of chronic disease hyperlipidemia Recent subcutaneous hematoma resolved Hypernatremia resolved Hyperkalemia  Lung  masses followup after discharge Encephalopathy; improving  Plan  Continue the same treatment Check BMP  in a.m.  Code Status: Full  Family Communication: At bedside    DVT Prophylaxis   SCDs   Carron Curie M.D on 11/01/2013 at 3:43 PM

## 2014-11-18 DEATH — deceased

## 2016-04-27 IMAGING — CT CT CHEST W/O CM
2 of 3 series · 15 of 36 positions shown, 18 images · non-contrast
Comparison: Chest radiograph -10/19/2013; 10/14/2013

CLINICAL DATA: Evaluate for potential lung mass seen on portable
chest radiograph

EXAM:
CT CHEST WITHOUT CONTRAST
TECHNIQUE: Multidetector CT imaging of the chest was performed following the
standard protocol without IV contrast..

[Series 2: thorax 5.0 i31f 1 · axial · 0.67mm/px · z∈[+1412,+1672]mm · 12 of 62 slices shown, 15 images]
[im 5/62  mediastinal]
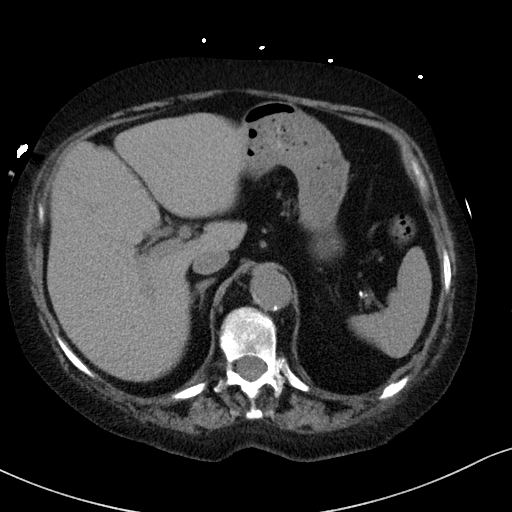
[im 5/62  lung]
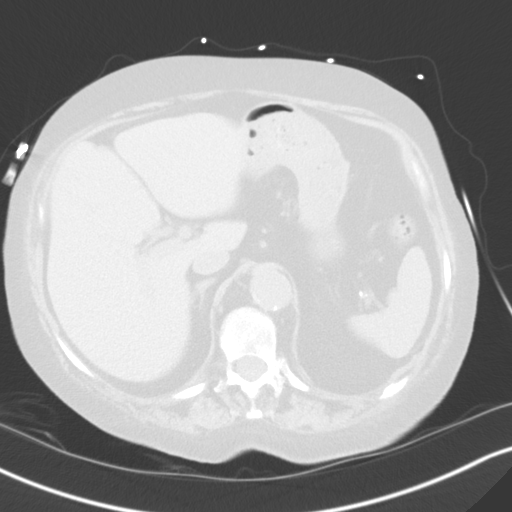
[im 10/62  lung]
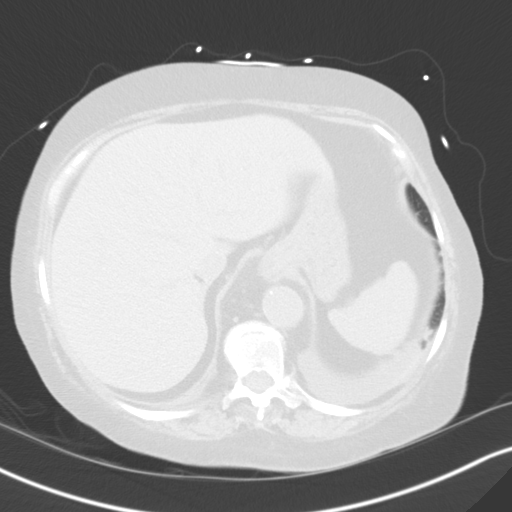
[im 14/62  lung]
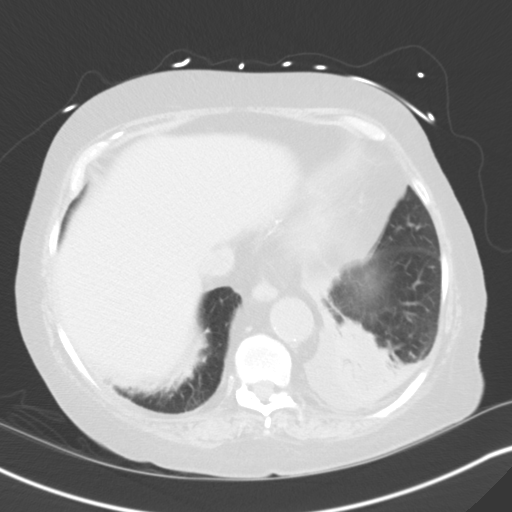
[im 19/62  lung]
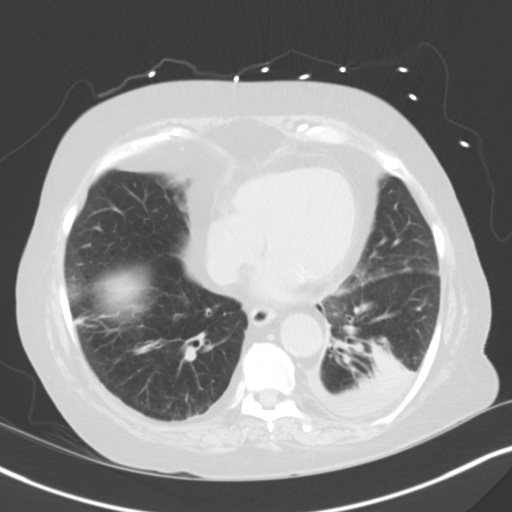
[im 23/62  mediastinal]
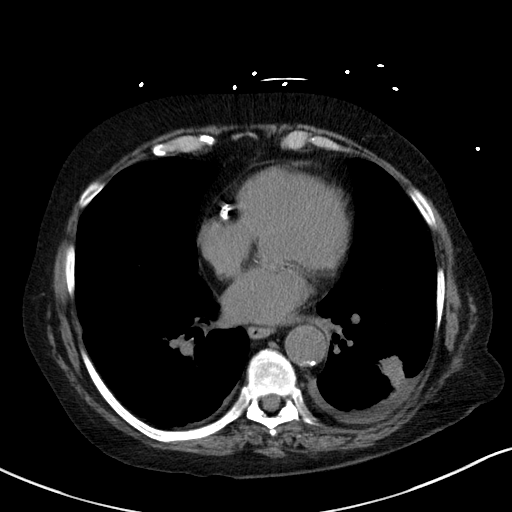
[im 23/62  lung]
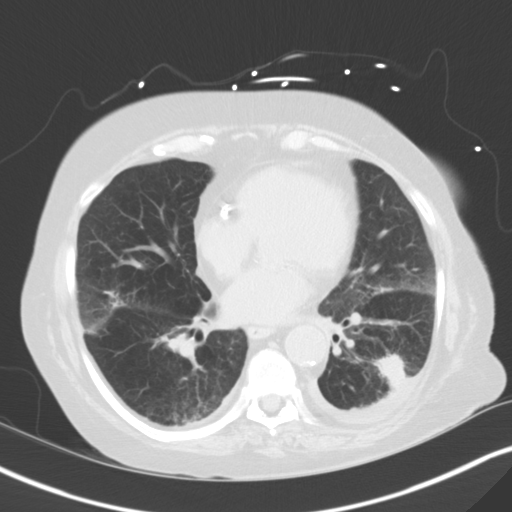
[im 28/62  lung]
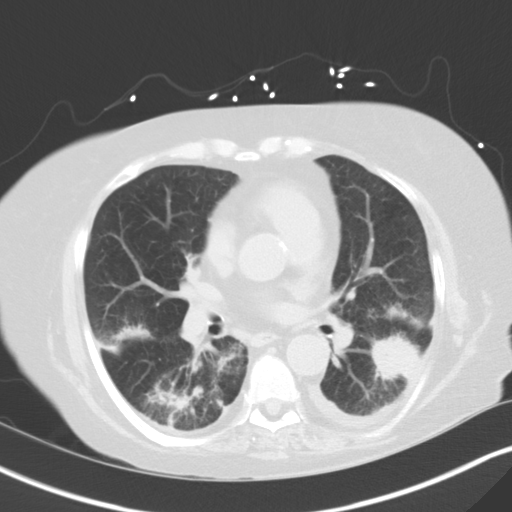
[im 34/62  lung]
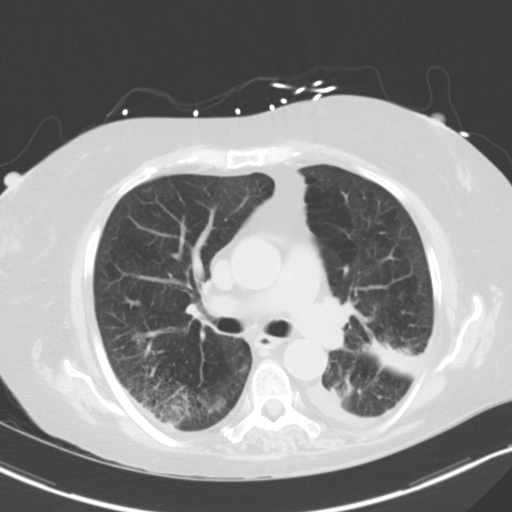
[im 39/62  lung]
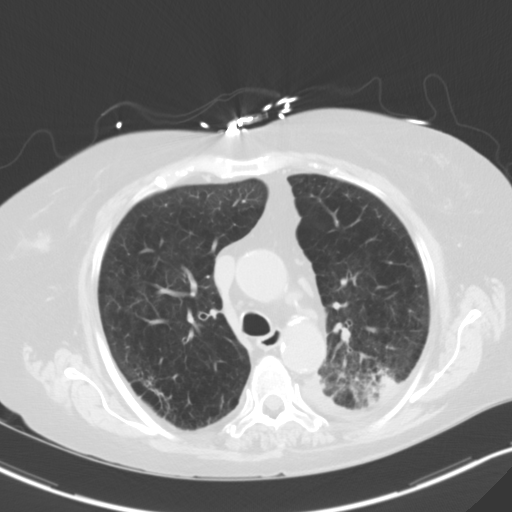
[im 43/62  mediastinal]
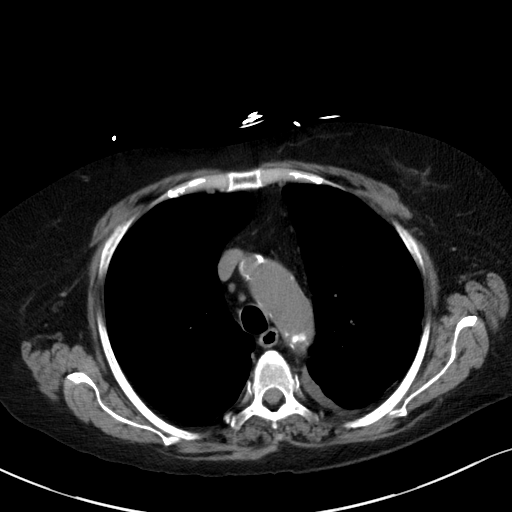
[im 43/62  lung]
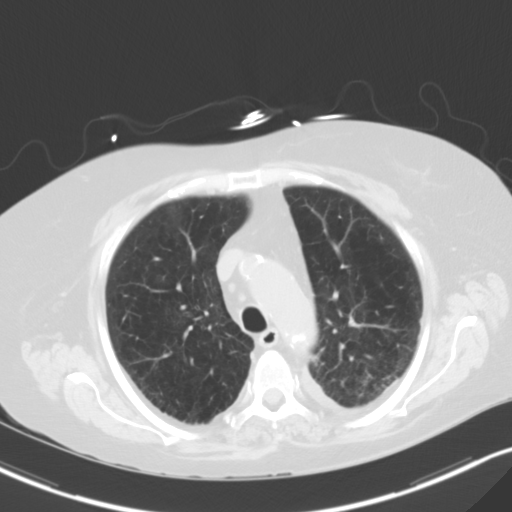
[im 48/62  lung]
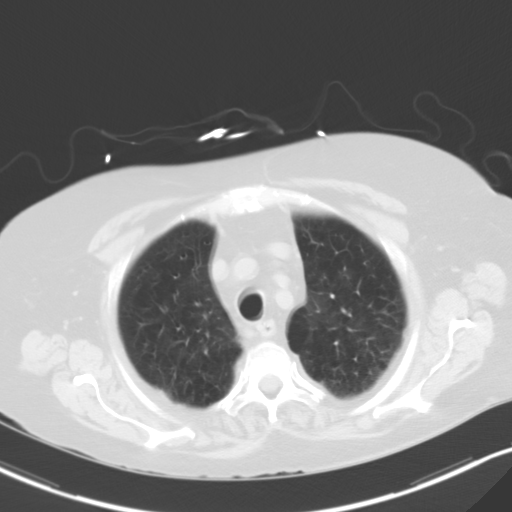
[im 52/62  lung]
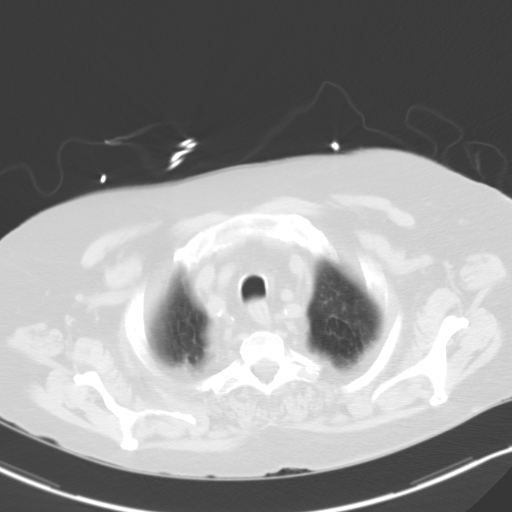
[im 57/62  lung]
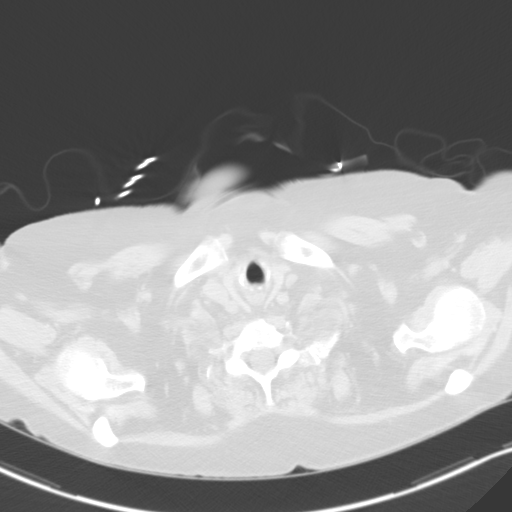

[Series 5: coronal · coronal · 0.60mm/px · 3 of 81 slices shown]
[im 17/81  lung]
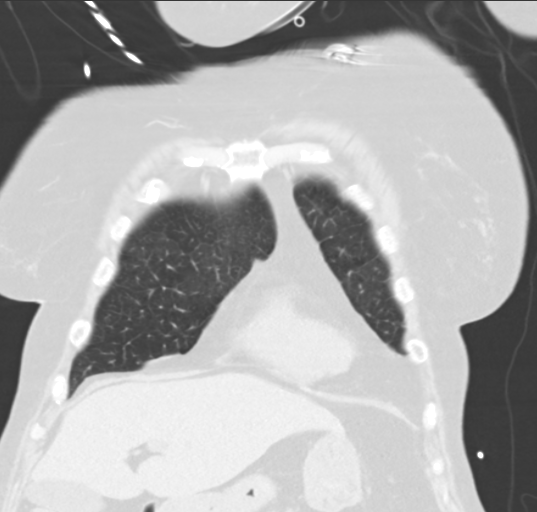
[im 33/81  lung]
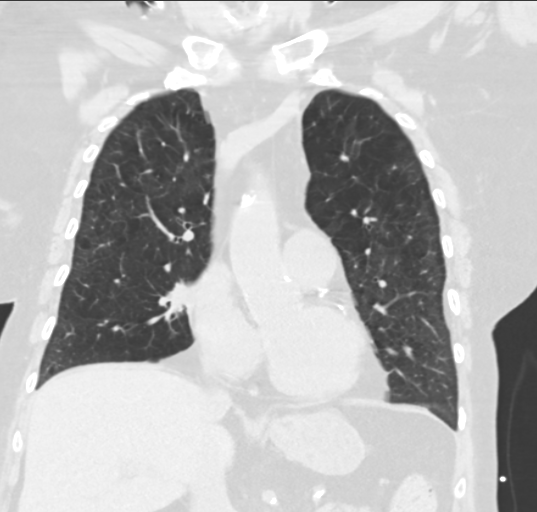
[im 49/81  lung]
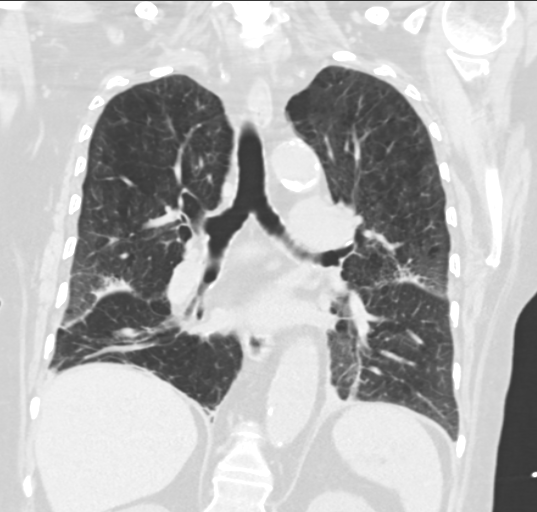

[15 of 36 positions shown; findings below may reference images not displayed]

FINDINGS: There is an approximately 2.9 x 3.2 cm nodular opacity within the
left lower lobe (image 35, series 3) which likely correlates with
the nodular opacity seen on prior chest radiograph. This nodular
opacity is associated with a small left-sided pleural effusion and
associated consolidative opacities within the more caudal aspect of
the left lower lobe (image 48, series 3). There is pleural
parenchymal thickening and consolidation extending along the
superior aspect of the left major fissure (representative image 27,
series 3).

There is a nodular airspace opacity within the superior segment of
the contralateral right lower lobe which measures approximately
x 1.7 cm (image 33, series 3). Similar to the left lung, there are
heterogeneous opacities about the superior segment of the right
major fissure (representative image 34, series 3). These findings
are all superimposed on advanced centrilobular emphysematous change.

No mediastinal, hilar or axillary lymphadenopathy on this
noncontrast examination. Mild mediastinal lipomatosis is suspected.
The central pulmonary airways are widely patent. No pneumothorax.

Normal heart size. Coronary artery calcifications. No pericardial
effusion. Scattered atherosclerotic plaque within a normal caliber
thoracic aorta. Conventional configuration of the aortic arch. No
definite intramural hematoma formation.

Limited noncontrast evaluation of the upper abdomen demonstrates
extensive calcifications within the splenic artery.

No acute or aggressive osseous abnormalities. Regional soft tissues
appear normal. Normal noncontrast appearance of the thyroid gland.
IMPRESSION: 1. Bilateral nodular airspace opacities, worse within the left lower
lobe, correlating with the findings seen on recently performed chest
radiograph. Given multiplicity of these nodular airspace opacities,
multi focal infection (including atypical etiologies) is suspected
though given advanced emphysematous change, underlying nodules/mass
are not excluded. As such, a follow-up chest CT in 4-6 weeks after
treatment is recommended to ensure resolution.
2. No definite mediastinal hilar lymphadenopathy on this noncontrast
examination.
3. Coronary artery calcifications.

## 2016-05-07 IMAGING — CR DG CHEST 1V PORT
1 series · 1 of 1 positions shown · non-contrast
Comparison: 10/19/2013

CLINICAL DATA: Shortness of breath and weakness

EXAM:
PORTABLE CHEST - 1 VIEW

[AP]
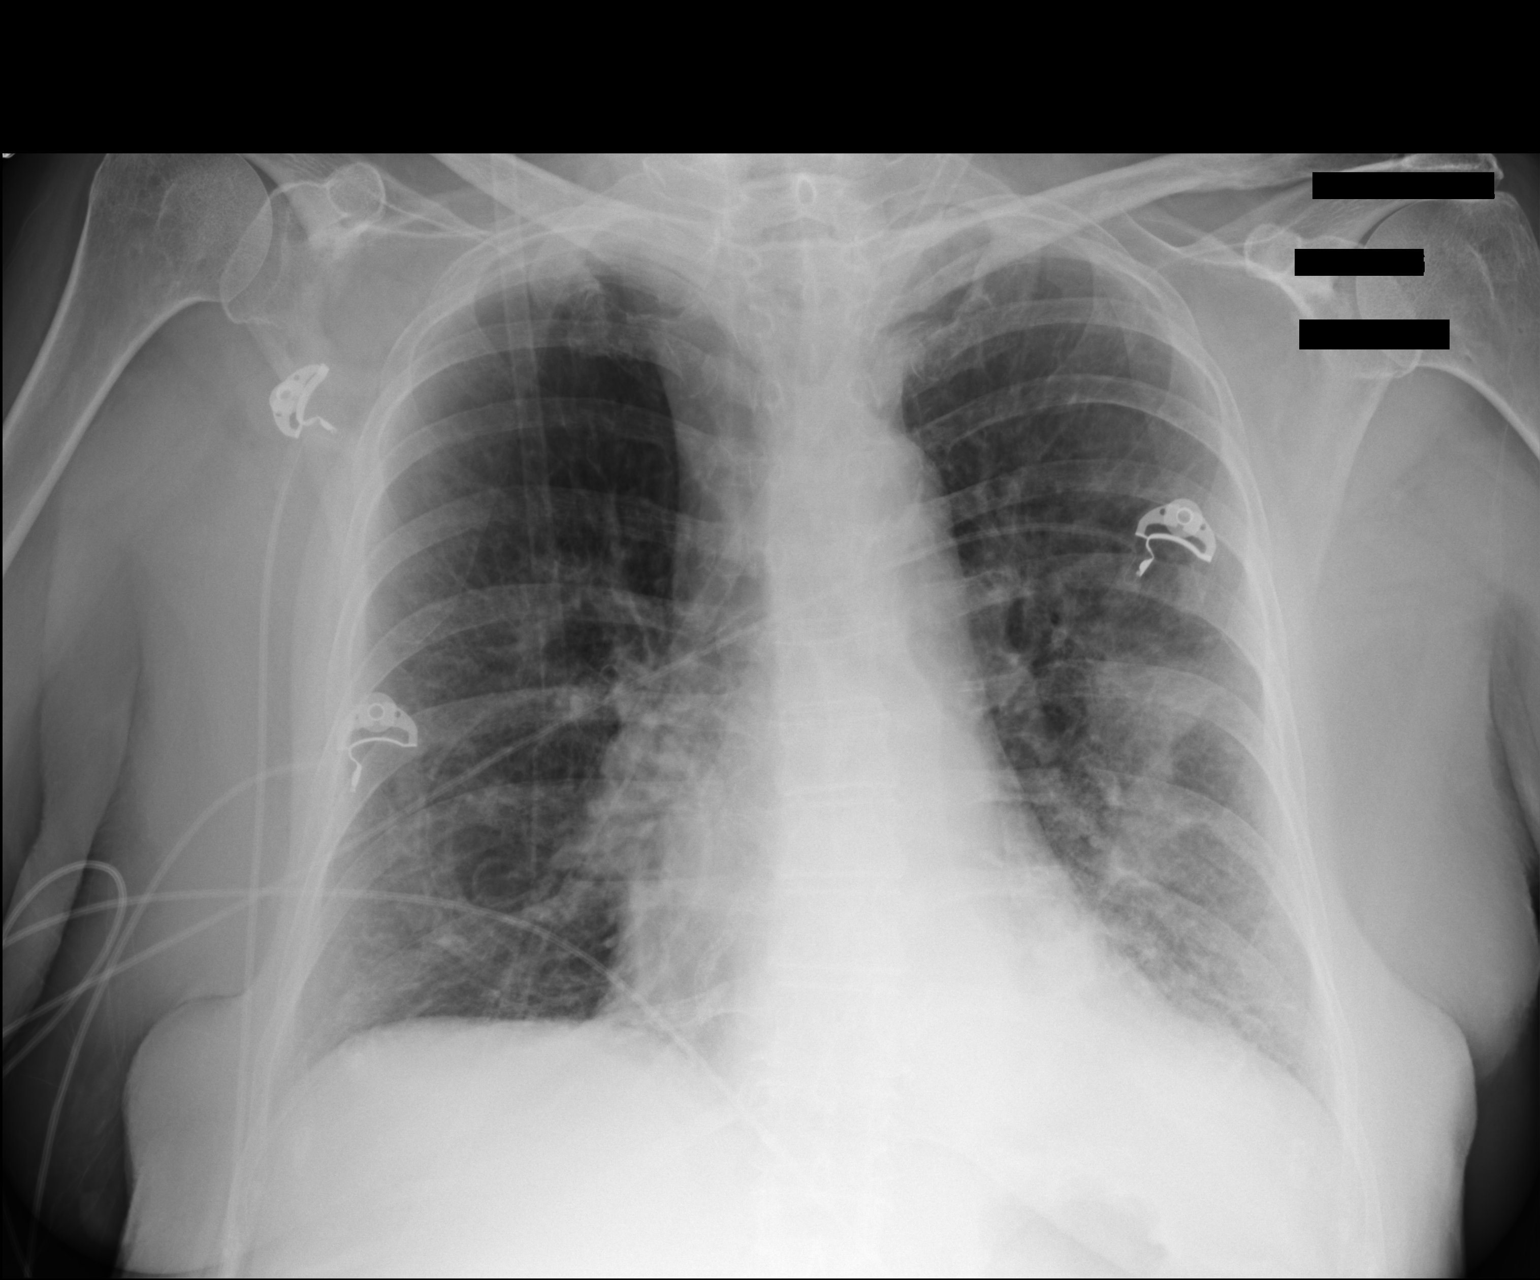

[1 of 1 positions shown; findings below may reference images not displayed]

FINDINGS: Cardiac shadow is stable. Patchy densities are again noted in the
left mid and lower lung. There has been some interval clearing in
the right lung base. No pneumothorax or effusion is seen. No gross
bony abnormality is noted.
IMPRESSION: Improved aeration in the right base. Persistent patchy infiltrates
are noted on the left.
